# Patient Record
Sex: Female | Born: 1990 | Race: White | Hispanic: No | Marital: Married | Smoking: Former smoker
Health system: Southern US, Community
[De-identification: ages and names within clinical notes are randomized; demographics above are authoritative.]

## PROBLEM LIST (undated history)

## (undated) DIAGNOSIS — O139 Gestational [pregnancy-induced] hypertension without significant proteinuria, unspecified trimester: Secondary | ICD-10-CM

## (undated) DIAGNOSIS — F329 Major depressive disorder, single episode, unspecified: Secondary | ICD-10-CM

## (undated) DIAGNOSIS — B999 Unspecified infectious disease: Secondary | ICD-10-CM

## (undated) DIAGNOSIS — I10 Essential (primary) hypertension: Secondary | ICD-10-CM

## (undated) DIAGNOSIS — D649 Anemia, unspecified: Secondary | ICD-10-CM

## (undated) DIAGNOSIS — F988 Other specified behavioral and emotional disorders with onset usually occurring in childhood and adolescence: Secondary | ICD-10-CM

## (undated) DIAGNOSIS — O009 Unspecified ectopic pregnancy without intrauterine pregnancy: Secondary | ICD-10-CM

## (undated) DIAGNOSIS — F32A Depression, unspecified: Secondary | ICD-10-CM

## (undated) DIAGNOSIS — R87629 Unspecified abnormal cytological findings in specimens from vagina: Secondary | ICD-10-CM

## (undated) HISTORY — PX: ECTOPIC PREGNANCY SURGERY: SHX613

## (undated) HISTORY — PX: COLPOSCOPY: SHX161

---
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
## 2012-04-02 HISTORY — PX: OTHER SURGICAL HISTORY: SHX169

## 2015-03-15 ENCOUNTER — Inpatient Hospital Stay (HOSPITAL_COMMUNITY)
Admission: AD | Admit: 2015-03-15 | Discharge: 2015-03-15 | Disposition: A | Discharge status: Home / Self Care | Payer: BLUE CROSS/BLUE SHIELD | Source: Ambulatory Visit | Attending: Family Medicine | Admitting: Family Medicine

## 2015-03-15 ENCOUNTER — Encounter (HOSPITAL_COMMUNITY): Payer: Self-pay | Admitting: *Deleted

## 2015-03-15 DIAGNOSIS — R102 Pelvic and perineal pain: Secondary | ICD-10-CM | POA: Diagnosis not present

## 2015-03-15 DIAGNOSIS — N9489 Other specified conditions associated with female genital organs and menstrual cycle: Secondary | ICD-10-CM

## 2015-03-15 HISTORY — DX: Unspecified ectopic pregnancy without intrauterine pregnancy: O00.90

## 2015-03-15 HISTORY — DX: Other specified behavioral and emotional disorders with onset usually occurring in childhood and adolescence: F98.8

## 2015-03-15 HISTORY — DX: Unspecified abnormal cytological findings in specimens from vagina: R87.629

## 2015-03-15 LAB — URINALYSIS, ROUTINE W REFLEX MICROSCOPIC
BILIRUBIN URINE: NEGATIVE
Glucose, UA: NEGATIVE mg/dL
HGB URINE DIPSTICK: NEGATIVE
KETONES UR: NEGATIVE mg/dL
Leukocytes, UA: NEGATIVE
Nitrite: NEGATIVE
PROTEIN: NEGATIVE mg/dL
Specific Gravity, Urine: 1.02 (ref 1.005–1.030)
pH: 6.5 (ref 5.0–8.0)

## 2015-03-15 LAB — WET PREP, GENITAL
Clue Cells Wet Prep HPF POC: NONE SEEN
Sperm: NONE SEEN
Trich, Wet Prep: NONE SEEN
Yeast Wet Prep HPF POC: NONE SEEN

## 2015-03-15 LAB — POCT PREGNANCY, URINE: PREG TEST UR: NEGATIVE

## 2015-03-15 NOTE — MAU Note (Signed)
Pt presents to MAU with complaints of having pelvic pressure for a couple of weeks. States she feels something protruding out of her vaginal area.

## 2015-03-15 NOTE — Discharge Instructions (Signed)
You can get Colace over the counter and use this for constipation. Take 1 tablet twice daily for 1 week and see if this helps with your symptoms.   You were provided with a list of doctors in the area. Please establish care with a PCP or GYN physician.

## 2015-03-15 NOTE — MAU Provider Note (Signed)
History     CSN: [id number] Arrival date and time: 03/15/15 1336  First Provider Initiated Contact with Patient 03/15/15 1413      Chief Complaint  Patient presents with  . Pelvic Pain   HPI NAARA KELTY is a 24 y.o. [phone] with a history of ectopic pregnancy with Left salpingectomy presenting with vaginal pressure/pain. This has been present for several days. She reports she "feels like something is coming out." No leaking urine. Reports constipation for the last week. She does not identify aggravating or alleviating factors.    OB History    Gravida Para Term Preterm AB TAB SAB Ectopic Multiple Living   0      Past Medical History  Diagnosis Date  . Ectopic pregnancy   . ADD (attention deficit disorder)   . Vaginal Pap smear, abnormal     Past Surgical History  Procedure Laterality Date  . Fallopian tube  04-2012    Left removal with ectopic pregnancy  . Colposcopy      History reviewed. No pertinent family history.  Social History  Substance Use Topics  . Smoking status: Never Smoker   . Smokeless tobacco: None  . Alcohol Use: Yes     Comment: socially    Allergies: No Known Allergies  No prescriptions prior to admission   Review of Systems  Constitutional: Negative for fever and chills.  Eyes: Negative for blurred vision and double vision.  Respiratory: Negative for cough and shortness of breath.   Cardiovascular: Negative for chest pain and orthopnea.  Gastrointestinal: Negative for nausea and vomiting.  Genitourinary: Negative for dysuria, frequency and flank pain.  Musculoskeletal: Negative for myalgias.  Skin: Negative for rash.  Neurological: Negative for dizziness, tingling, weakness and headaches.  Endo/Heme/Allergies: Does not bruise/bleed easily.  Psychiatric/Behavioral: Negative for depression and suicidal ideas. The patient is not nervous/anxious.    Physical Exam   Blood pressure 145/81, pulse 74, temperature 98.2 F  (36.8 C), resp. rate 18, height  (1.676 m), weight 250 lb (113.399 kg), last menstrual period 01/31/2015.  Physical Exam  Nursing note and vitals reviewed. Constitutional: She is oriented to person, place, and time. She appears well-developed and well-nourished. No distress.  HENT:  Head: Normocephalic and atraumatic.  Eyes: Conjunctivae are normal. No scleral icterus.  Neck: Normal range of motion. Neck supple.  Cardiovascular: Normal rate and intact distal pulses.   Respiratory: Effort normal. She exhibits no tenderness.  GI: Soft. There is no tenderness. There is no rebound and no guarding.  Genitourinary: No labial fusion. There is no rash or tenderness on the right labia. There is no rash or tenderness on the left labia. No erythema in the vagina. Tenderness: thick white discharge. No signs of injury around the vagina. Vaginal discharge found.  While visualizing the perineum had patient bear down, no protrusion. Labia were spread and again patient performed valsalva which did not show any protrusion. Specululm exam s/f thick white discharge. Bimanual performed and patient had excellent pelvic floor muscle use  Musculoskeletal: Normal range of motion. She exhibits no edema.  Neurological: She is alert and oriented to person, place, and time.  Skin: Skin is warm and dry. No rash noted.  Psychiatric: She has a normal mood and affect.    MAU Course  Procedures  MDM UPT- negative UA- non infectious appearing GC/CT- collected Wet prep- negative   Assessment and Plan  TYWANDA RICE is a 24  y.o. [id number] presenting with vaginal pressure with completely normal exam. Possibly having constipation that effects her sensation of pressure. Recommended use of OTC medications and establishing care with GYN or PCP. She was offered reassurance and sent home. Questions were answered and return precautions reviewed.   Isa RankinKimberly Niles [facility] 03/15/2015, 2:55 PM

## 2015-03-16 LAB — GC/CHLAMYDIA PROBE AMP (~~LOC~~) NOT AT ARMC
Chlamydia: NEGATIVE
Neisseria Gonorrhea: NEGATIVE

## 2018-05-06 LAB — OB RESULTS CONSOLE HIV ANTIBODY (ROUTINE TESTING): HIV: NONREACTIVE

## 2018-05-06 LAB — OB RESULTS CONSOLE ANTIBODY SCREEN: Antibody Screen: NEGATIVE

## 2018-05-06 LAB — OB RESULTS CONSOLE GBS: GBS: POSITIVE

## 2018-05-06 LAB — OB RESULTS CONSOLE RUBELLA ANTIBODY, IGM: Rubella: IMMUNE

## 2018-05-06 LAB — OB RESULTS CONSOLE ABO/RH: RH Type: POSITIVE

## 2018-05-06 LAB — OB RESULTS CONSOLE HEPATITIS B SURFACE ANTIGEN: Hepatitis B Surface Ag: NEGATIVE

## 2018-05-06 LAB — OB RESULTS CONSOLE GC/CHLAMYDIA
Chlamydia: NEGATIVE
Gonorrhea: NEGATIVE

## 2018-05-06 LAB — OB RESULTS CONSOLE RPR: RPR: NONREACTIVE

## 2018-09-23 ENCOUNTER — Other Ambulatory Visit: Payer: Self-pay | Admitting: Obstetrics and Gynecology

## 2018-11-19 ENCOUNTER — Encounter (HOSPITAL_COMMUNITY): Payer: Self-pay | Admitting: *Deleted

## 2018-11-19 ENCOUNTER — Telehealth (HOSPITAL_COMMUNITY): Payer: Self-pay | Admitting: *Deleted

## 2018-11-19 NOTE — Patient Instructions (Signed)
Joan Garcia  11/19/2018   Your procedure is scheduled on:  11/30/2018  Arrive at 62 at Entrance C on [address] [facility]  and [address]. You are invited to use the FREE valet parking or use the Visitor's parking deck.  Pick up the phone at the desk and dial [phone].  Call this number if you have problems the morning of surgery: [phone]  Remember:   Do not eat food:(After Midnight) Desps de medianoche.  Do not drink clear liquids: (After Midnight) Desps de medianoche.  Take these medicines the morning of surgery with A SIP OF WATER:  none   Do not wear jewelry, make-up or nail polish.  Do not wear lotions, powders, or perfumes. Do not wear deodorant.  Do not shave 48 hours prior to surgery.  Do not bring valuables to the hospital.  [facility] is not   responsible for any belongings or valuables brought to the hospital.  Contacts, dentures or bridgework may not be worn into surgery.  Leave suitcase in the car. After surgery it may be brought to your room.  For patients admitted to the hospital, checkout time is 11:00 AM the day of              discharge.      Please read over the following fact sheets that you were given:     Preparing for Surgery

## 2018-11-19 NOTE — Telephone Encounter (Signed)
Preadmission screen  

## 2018-11-20 ENCOUNTER — Telehealth (HOSPITAL_COMMUNITY): Payer: Self-pay | Admitting: *Deleted

## 2018-11-20 NOTE — Telephone Encounter (Signed)
Preadmission screen  

## 2018-11-21 ENCOUNTER — Encounter (HOSPITAL_COMMUNITY): Payer: Self-pay

## 2018-11-24 ENCOUNTER — Inpatient Hospital Stay (HOSPITAL_COMMUNITY): Payer: BC Managed Care – PPO

## 2018-11-24 ENCOUNTER — Encounter (HOSPITAL_COMMUNITY): Payer: Self-pay | Admitting: *Deleted

## 2018-11-24 ENCOUNTER — Inpatient Hospital Stay (HOSPITAL_COMMUNITY)
Admission: AD | Admit: 2018-11-24 | Discharge: 2018-11-28 | DRG: 786 | Disposition: A | Discharge status: Home / Self Care | Payer: BC Managed Care – PPO | Attending: Obstetrics and Gynecology | Admitting: Obstetrics and Gynecology

## 2018-11-24 ENCOUNTER — Other Ambulatory Visit: Payer: Self-pay

## 2018-11-24 DIAGNOSIS — M545 Low back pain, unspecified: Secondary | ICD-10-CM

## 2018-11-24 DIAGNOSIS — Z20828 Contact with and (suspected) exposure to other viral communicable diseases: Secondary | ICD-10-CM | POA: Diagnosis present

## 2018-11-24 DIAGNOSIS — O4593 Premature separation of placenta, unspecified, third trimester: Secondary | ICD-10-CM | POA: Diagnosis present

## 2018-11-24 DIAGNOSIS — Z3A38 38 weeks gestation of pregnancy: Secondary | ICD-10-CM

## 2018-11-24 DIAGNOSIS — O99824 Streptococcus B carrier state complicating childbirth: Secondary | ICD-10-CM | POA: Diagnosis present

## 2018-11-24 DIAGNOSIS — O99214 Obesity complicating childbirth: Secondary | ICD-10-CM | POA: Diagnosis present

## 2018-11-24 DIAGNOSIS — O99213 Obesity complicating pregnancy, third trimester: Secondary | ICD-10-CM

## 2018-11-24 DIAGNOSIS — O34211 Maternal care for low transverse scar from previous cesarean delivery: Principal | ICD-10-CM | POA: Diagnosis present

## 2018-11-24 DIAGNOSIS — O26893 Other specified pregnancy related conditions, third trimester: Secondary | ICD-10-CM

## 2018-11-24 DIAGNOSIS — Z6841 Body Mass Index (BMI) 40.0 and over, adult: Secondary | ICD-10-CM

## 2018-11-24 DIAGNOSIS — O403XX0 Polyhydramnios, third trimester, not applicable or unspecified: Secondary | ICD-10-CM | POA: Diagnosis present

## 2018-11-24 DIAGNOSIS — O139 Gestational [pregnancy-induced] hypertension without significant proteinuria, unspecified trimester: Secondary | ICD-10-CM

## 2018-11-24 DIAGNOSIS — O134 Gestational [pregnancy-induced] hypertension without significant proteinuria, complicating childbirth: Secondary | ICD-10-CM | POA: Diagnosis present

## 2018-11-24 DIAGNOSIS — Z87891 Personal history of nicotine dependence: Secondary | ICD-10-CM

## 2018-11-24 DIAGNOSIS — O288 Other abnormal findings on antenatal screening of mother: Secondary | ICD-10-CM

## 2018-11-24 DIAGNOSIS — O9081 Anemia of the puerperium: Secondary | ICD-10-CM | POA: Diagnosis not present

## 2018-11-24 DIAGNOSIS — R102 Pelvic and perineal pain: Secondary | ICD-10-CM

## 2018-11-24 DIAGNOSIS — O9989 Other specified diseases and conditions complicating pregnancy, childbirth and the puerperium: Secondary | ICD-10-CM

## 2018-11-24 DIAGNOSIS — O289 Unspecified abnormal findings on antenatal screening of mother: Secondary | ICD-10-CM

## 2018-11-24 DIAGNOSIS — O133 Gestational [pregnancy-induced] hypertension without significant proteinuria, third trimester: Secondary | ICD-10-CM

## 2018-11-24 HISTORY — DX: Unspecified infectious disease: B99.9

## 2018-11-24 HISTORY — DX: Depression, unspecified: F32.A

## 2018-11-24 LAB — COMPREHENSIVE METABOLIC PANEL
ALT: 17 U/L (ref 0–44)
AST: 21 U/L (ref 15–41)
Albumin: 2.5 g/dL — ABNORMAL LOW (ref 3.5–5.0)
Alkaline Phosphatase: 91 U/L (ref 38–126)
Anion gap: 10 (ref 5–15)
BUN: 5 mg/dL — ABNORMAL LOW (ref 6–20)
CO2: 19 mmol/L — ABNORMAL LOW (ref 22–32)
Calcium: 8.9 mg/dL (ref 8.9–10.3)
Chloride: 109 mmol/L (ref 98–111)
Creatinine, Ser: 0.59 mg/dL (ref 0.44–1.00)
GFR calc Af Amer: >60 mL/min (ref >60)
GFR calc non Af Amer: >60 mL/min (ref >60)
Glucose, Bld: 107 mg/dL — ABNORMAL HIGH (ref 70–99)
Potassium: 3.6 mmol/L (ref 3.5–5.1)
Sodium: 138 mmol/L (ref 135–145)
Total Bilirubin: 0.5 mg/dL (ref 0.3–1.2)
Total Protein: 5.9 g/dL — ABNORMAL LOW (ref 6.5–8.1)

## 2018-11-24 LAB — PROTEIN / CREATININE RATIO, URINE
Creatinine, Urine: 92.32 mg/dL
Protein Creatinine Ratio: 0.09 mg/mg{Cre} (ref 0.00–0.15)
Total Protein, Urine: 8 mg/dL

## 2018-11-24 LAB — CBC
HCT: 32.8 % — ABNORMAL LOW (ref 36.0–46.0)
Hemoglobin: 10.5 g/dL — ABNORMAL LOW (ref 12.0–15.0)
MCH: 27.8 pg (ref 26.0–34.0)
MCHC: 32 g/dL (ref 30.0–36.0)
MCV: 86.8 fL (ref 80.0–100.0)
Platelets: 199 10*3/uL (ref 150–400)
RBC: 3.78 MIL/uL — ABNORMAL LOW (ref 3.87–5.11)
RDW: 14.7 % (ref 11.5–15.5)
WBC: 10.5 10*3/uL (ref 4.0–10.5)
nRBC: 0 % (ref 0.0–0.2)

## 2018-11-24 LAB — TYPE AND SCREEN
ABO/RH(D): A POS
Antibody Screen: NEGATIVE

## 2018-11-24 MED ORDER — CALCIUM CARBONATE ANTACID 500 MG PO CHEW
2.0000 | CHEWABLE_TABLET | ORAL | Status: DC | PRN
  Filled 2018-11-24: qty 2

## 2018-11-24 MED ORDER — ZOLPIDEM TARTRATE 5 MG PO TABS: 5.0000 mg | ORAL_TABLET | Freq: Every evening | ORAL | Status: DC | PRN

## 2018-11-24 MED ORDER — DOCUSATE SODIUM 100 MG PO CAPS
100.0000 mg | ORAL_CAPSULE | Freq: Every day | ORAL | Status: DC
  Administered 2018-11-24: 20:00:00 100 mg via ORAL
  Filled 2018-11-24: qty 1

## 2018-11-24 MED ORDER — SODIUM CHLORIDE 0.9 % IV SOLN: 250.0000 mL | INTRAVENOUS | Status: DC | PRN

## 2018-11-24 MED ORDER — SODIUM CHLORIDE 0.9% FLUSH: 3.0000 mL | INTRAVENOUS | Status: DC | PRN

## 2018-11-24 MED ORDER — LACTATED RINGERS IV SOLN
INTRAVENOUS | Status: DC
  Administered 2018-11-25 (×2): via INTRAVENOUS

## 2018-11-24 MED ORDER — CEFAZOLIN SODIUM-DEXTROSE 2-4 GM/100ML-% IV SOLN
2.0000 g | INTRAVENOUS | Status: DC
  Filled 2018-11-24: qty 100

## 2018-11-24 MED ORDER — ACETAMINOPHEN 325 MG PO TABS
650.0000 mg | ORAL_TABLET | ORAL | Status: DC | PRN
  Administered 2018-11-24: 22:00:00 650 mg via ORAL
  Filled 2018-11-24: qty 2

## 2018-11-24 MED ORDER — SODIUM CHLORIDE 0.9% FLUSH: 3.0000 mL | Freq: Two times a day (BID) | INTRAVENOUS | Status: DC

## 2018-11-24 NOTE — MAU Note (Signed)
Sent from office with non-reactive tracing.  Being followed with polyhydramnios. No leaking or bleeding.  Some pelvic and low back pain.

## 2018-11-24 NOTE — H&P (Signed)
OB ADMISSION/ HISTORY & PHYSICAL:  Admission Date: 11/24/2018  2:10 PM  Admit Diagnosis: Joan Garcia, Joan Garcia is a 28 y.o. female,  [phone] at 46w1dpresenting to MAU after she had a non-reactive NST in the office today. BP noted elevated in MAU, PEC work up negative and antenatal fetal testing reassuring, BPP 8/8, polyhydramnios slightly improved from previous. Decision is made to admit patient overnight with planned repeat cesarean section in AM, NPO after midnight.    Prenatal History: [id number]  EDC : 12/07/2018, by [facility]  [facility]   Prenatal course complicated by: Morbid obesity (BMI 52.7), polyhydramnios, gestational HTN, anemia, GBS positive, previous cesarean section x 1, ADHD, hx postpartum depression, dependent edema  Current medications/supplements:   Prenatal Labs: ABO, Rh: A positive(02/04 0000)  Antibody: Negative (02/04 0000) Rubella: Immune (02/04 0000)  RPR: Nonreactive (02/04 0000)  HBsAg: Negative (02/04 0000)  HIV: Non-reactive (02/04 0000)  GBS: Positive (02/04 0000)  1 hr Glucola : 105 Genetic Screening: AFP normal, normal NT sono Ultrasound: normal anatomy, anterior placenta  26 wks F/U sono for heart and face views, noted AFI increased 26.2 cm 33 wks AFI 33.3 cm 36 wks AFI 36.6 cm, EFW 7 lbs, 81%tile    Medical / Surgical History :  Past medical history:  Past Medical History:  Diagnosis Date  . ADD (attention deficit disorder)   . Anemia   . Depression    hx post partum  . Ectopic pregnancy   . Hypertension   . Infection    UTI  . Pregnancy induced hypertension   . Vaginal Pap smear, abnormal      Past surgical history:  Past Surgical History:  Procedure Laterality Date  . CESAREAN SECTION    . COLPOSCOPY    . ECTOPIC PREGNANCY SURGERY     laparotomy  . fallopian tube  04-2012   Left removal with ectopic pregnancy     Family History:  Family History   Problem Relation Age of Onset  . Asthma Mother   . Heart failure Maternal Grandmother      Social History:  reports that she has quit smoking. She has never used smokeless tobacco. She reports previous alcohol use. She reports that she does not use drugs.   Allergies: Patient has no known allergies.   Current Medications at time of admission:  PNVs, iron, Flexeril  Review of Systems: ROS Pt denies VB, LOF, ctx, decreased FM, vaginal discharge/odor/itching. Pt denies N/V, abdominal pain, constipation, diarrhea, or urinary problems. Pt denies fever, chills, fatigue, sweating or changes in appetite. Pt denies SOB or chest pain. Pt denies dizziness, HA, light-headedness, weakness.  Physical Exam: Vital signs and nursing notes reviewed.  ED Triage Vitals  Enc Vitals Group     BP 11/24/18 1452 (!) 140/91     Pulse Rate 11/24/18 1452 (!) 106     Resp 11/24/18 1452 18     Temp 11/24/18 1452 98.7 F (37.1 C)     Temp Source 11/24/18 1452 Oral     SpO2 11/24/18 1455 98 %     Weight 11/24/18 1426 (!) 343 lb 12.8 oz (155.9 kg)     Height 11/24/18 1426 5' 8" (1.727 m)     Pain Score 11/24/18 1451 5   . Vitals:   11/24/18 1801 11/24/18 1816 11/24/18 1831 11/24/18 2027  BP: (!) 142/87 (!) 136/110 114/74 124/88  Pulse: 100 (!) 119  92  Resp:    18  Temp:    97.9 F (36.6 C)  TempSrc:    Oral  SpO2:    100%  Weight:      Height:       General: AAO x 3, NAD Heart: RRR Lungs:CTAB Abdomen: Gravid, NT, obese Extremities: +1 pedal edema, SCD's while in bed Genitalia / VE:   deferred  FHR: 135 BPM, moderate variability, + accels, no decels TOCO: Ctx mild, patient does not feel them  Labs:    Recent Labs    11/24/18 1533  WBC 10.5  HGB 10.5*  HCT 32.8*  PLT 199     Hepatic Function Latest Ref Rng & Units 11/24/2018  Total Protein 6.5 - 8.1 g/dL 5.9(L)  Albumin 3.5 - 5.0 g/dL 2.5(L)  AST 15 - 41 U/L 21  ALT 0 - 44 U/L 17  Alk Phosphatase 38 - 126 U/L 91  Total  Bilirubin 0.3 - 1.2 mg/dL 0.5   Pending: RPR, COVID  Assessment:  28 y.o. 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 RHavery MorosCNM, MSN 11/24/2018, 7:39 PM

## 2018-11-24 NOTE — MAU Provider Note (Signed)
History     CSN: [id number]  Arrival date and time: 11/24/18 1410   First Provider Initiated Contact with Patient 11/24/18 1541      Chief Complaint  Patient presents with  . non-reactive fetal tracing   Joan Garcia is a 28 y.o. [phone] at 3467w1d who presents to MAU after she had a non-reactive NST in the office today. Pt reports pelvic pain and low back pain. Pt reports the pelvic pain started late last night, but the back pain has been on-going for weeks, but worse the past few days. Rates pelvic pain 5/10, back pain as 7/10. Pt reports she does have a pregnancy support belt at home that she wears "here and there." Pt has repeat C/S schedule for 11/30/2018, elective. Pt denies any symptoms today.  Pt denies VB, LOF, ctx, decreased FM, vaginal discharge/odor/itching. Pt denies N/V, abdominal pain, constipation, diarrhea, or urinary problems. Pt denies fever, chills, fatigue, sweating or changes in appetite. Pt denies SOB or chest pain. Pt denies dizziness, HA, light-headedness, weakness.  Problems this pregnancy include: polyhydramnios, cHTN?, anemia. Allergies? NKDA Current medications/supplements? PNVs, iron, Flexeril Prenatal care provider? [facility], next appt 11/26/2018   OB History    Gravida  4   Para  1   Term  1   Preterm      AB  2   Living  1     SAB  1   TAB      Ectopic  1   Multiple      Live Births  1        Obstetric Comments  Failure to progress, "cord in the way, when they went to break water:        Past Medical History:  Diagnosis Date  . ADD (attention deficit disorder)   . Anemia   . Depression    hx post partum  . Ectopic pregnancy   . Hypertension   . Infection    UTI  . Pregnancy induced hypertension   . Vaginal Pap smear, abnormal     Past Surgical History:  Procedure Laterality Date  . CESAREAN SECTION    . COLPOSCOPY    . ECTOPIC PREGNANCY SURGERY     laparotomy  . fallopian tube  04-2012   Left removal  with ectopic pregnancy    Family History  Problem Relation Age of Onset  . Asthma Mother   . Heart failure Maternal Grandmother     Social History   Tobacco Use  . Smoking status: Former Games developermoker  . Smokeless tobacco: Never Used  . Tobacco comment: quit age 28  Substance Use Topics  . Alcohol use: Not Currently    Comment: socially  . Drug use: No    Allergies: No Known Allergies  No medications prior to admission.    Review of Systems  Constitutional: Negative for chills, diaphoresis, fatigue and fever.  Respiratory: Negative for shortness of breath.   Cardiovascular: Negative for chest pain.  Gastrointestinal: Negative for abdominal pain, constipation, diarrhea, nausea and vomiting.  Genitourinary: Positive for pelvic pain. Negative for dysuria, flank pain, frequency, urgency, vaginal bleeding and vaginal discharge.  Musculoskeletal: Positive for back pain.  Neurological: Negative for dizziness, weakness, light-headedness and headaches.   Physical Exam   Blood pressure (!) 142/87, pulse 100, temperature 98.7 F (37.1 C), temperature source Oral, resp. rate 18, height 5\' 8"  (1.727 m), weight (!) 155.9 kg, SpO2 99 %.  Patient Vitals for the past 24 hrs:  BP Temp  Temp src Pulse Resp SpO2 Height Weight  11/24/18 1801 (!) 142/87 - - 100 - - - -  11/24/18 1751 (!) 138/97 - - (!) 104 - - - -  11/24/18 1700 122/83 - - (!) 115 - - - -  11/24/18 1646 (!) 157/100 - - 99 - - - -  11/24/18 1631 (!) 143/95 - - (!) 101 - - - -  11/24/18 1616 127/77 - - (!) 149 - - - -  11/24/18 1600 (!) 143/93 - - 96 - 99 % - -  11/24/18 1530 135/87 - - (!) 112 - 97 % - -  11/24/18 1515 (!) 132/91 - - (!) 101 - 97 % - -  11/24/18 1503 121/84 - - (!) 103 - - - -  11/24/18 1455 - - - - - 98 % - -  11/24/18 1452 (!) 140/91 98.7 F (37.1 C) Oral (!) 106 18 - - -  11/24/18 1426 - - - - - - 5\' 8"  (1.727 m) (!) 155.9 kg   Physical Exam  Constitutional: She is oriented to person, place, and time.  She appears well-developed and well-nourished. No distress.  HENT:  Head: Normocephalic and atraumatic.  Respiratory: Effort normal.  GI: Soft. She exhibits no distension and no mass. There is no abdominal tenderness. There is no rebound and no guarding.  Genitourinary:    Genitourinary Comments: CE: long/closed/posterior   Neurological: She is alert and oriented to person, place, and time.  Skin: Skin is warm and dry. She is not diaphoretic.  Psychiatric: She has a normal mood and affect. Her behavior is normal. Judgment and thought content normal.   Results for orders placed or performed during the hospital encounter of 11/24/18 (from the past 24 hour(s))  Protein / creatinine ratio, urine     Status: None   Collection Time: 11/24/18  3:30 PM  Result Value Ref Range   Creatinine, Urine 92.32 mg/dL   Total Protein, Urine 8 mg/dL   Protein Creatinine Ratio 0.09 0.00 - 0.15 mg/mg[Cre]  CBC     Status: Abnormal   Collection Time: 11/24/18  3:33 PM  Result Value Ref Range   WBC 10.5 4.0 - 10.5 K/uL   RBC 3.78 (L) 3.87 - 5.11 MIL/uL   Hemoglobin 10.5 (L) 12.0 - 15.0 g/dL   HCT 32.8 (L) 36.0 - 46.0 %   MCV 86.8 80.0 - 100.0 fL   MCH 27.8 26.0 - 34.0 pg   MCHC 32.0 30.0 - 36.0 g/dL   RDW 14.7 11.5 - 15.5 %   Platelets 199 150 - 400 K/uL   nRBC 0.0 0.0 - 0.2 %  Comprehensive metabolic panel     Status: Abnormal   Collection Time: 11/24/18  3:33 PM  Result Value Ref Range   Sodium 138 135 - 145 mmol/L   Potassium 3.6 3.5 - 5.1 mmol/L   Chloride 109 98 - 111 mmol/L   CO2 19 (L) 22 - 32 mmol/L   Glucose, Bld 107 (H) 70 - 99 mg/dL   BUN 5 (L) 6 - 20 mg/dL   Creatinine, Ser 0.59 0.44 - 1.00 mg/dL   Calcium 8.9 8.9 - 10.3 mg/dL   Total Protein 5.9 (L) 6.5 - 8.1 g/dL   Albumin 2.5 (L) 3.5 - 5.0 g/dL   AST 21 15 - 41 U/L   ALT 17 0 - 44 U/L   Alkaline Phosphatase 91 38 - 126 U/L   Total Bilirubin 0.5 0.3 - 1.2 mg/dL  GFR calc non Af Amer >60 >60 mL/min   GFR calc Af Amer >60 >60  mL/min   Anion gap 10 5 - 15   No results found.  MAU Course  Procedures  MDM -NRNST in office, elevated BP in MAU, denies s/sx of preeclampsia -spoke with Dr. Estanislado Pandyivard @1525 , requests BPP, reports pt has possible cHTN but has not had any protein in urine in office. Dr. Estanislado Pandyivard states that if NST/BPP/labs are normal, pt can be discharged home. -CE: long/closed/posterior -CBC: H/H 10.5/32.8, otherwise WNL (platelets 199) -CMP: no abnormalities requiring treatment (AST/ALT 21/17) -PCr: 0.09 -EFM: reactive with few variables       -baseline: 150       -variability: moderate       -accels: present, 15x15       -decels: few variable       -TOCO: few, irregular ctx -BPP: 8/8, AFI ~39cm -called and spoke with Dr. Estanislado Pandyivard @1757 , discussed lab results, NST and BP and recommended patient stay for C/S tonight d/t gHTN @38wks  as patient states she has had elevated BPs in pregnancy only after [redacted]wks gestation and per [facility] OB notes state PIH work-up @34wks . Per Dr. Estanislado Pandyivard, [facility] and monitoring of BP and for C/S in AM. Dr. Estanislado Pandyivard to enter admission orders and patient to be NPO. -[facility] and plan for AM C/S  Orders Placed This Encounter  Procedures  . US MFM FETAL BPP WO NON STRESS    Standing Status:   Standing    Number of Occurrences:   1    Order Specific Question:   Symptom/Reason for Exam    Answer:   Non-reactive NST (non-stress test) [[id number]][[id number]]  . CBC    Standing Status:   Standing    Number of Occurrences:   1  . Comprehensive metabolic panel    Standing Status:   Standing    Number of Occurrences:   1  . Protein / creatinine ratio, urine    Standing Status:   Standing    Number of Occurrences:   1   No orders of the defined types were placed in this encounter.  Assessment and Plan   1. Gestational hypertension, third trimester   2. Non-reactive NST (non-stress test)   3. Pelvic pain   4. Low back pain  during pregnancy in third trimester   5. Polyhydramnios in third trimester complication, single or unspecified fetus    -[facility] and plan for C/S in AM  Monicia Tse E Nolyn Eilert 11/24/2018, 6:23 PM

## 2018-11-25 ENCOUNTER — Encounter (HOSPITAL_COMMUNITY): Payer: Self-pay | Admitting: Nurse Practitioner

## 2018-11-25 ENCOUNTER — Inpatient Hospital Stay (HOSPITAL_COMMUNITY): Payer: BC Managed Care – PPO | Admitting: Anesthesiology

## 2018-11-25 ENCOUNTER — Inpatient Hospital Stay (HOSPITAL_COMMUNITY): Admit: 2018-11-25 | Payer: BLUE CROSS/BLUE SHIELD | Admitting: Obstetrics and Gynecology

## 2018-11-25 ENCOUNTER — Encounter (HOSPITAL_COMMUNITY)
Admission: AD | Disposition: A | Discharge status: Home / Self Care | Payer: Self-pay | Source: Home / Self Care | Attending: Obstetrics and Gynecology

## 2018-11-25 ENCOUNTER — Other Ambulatory Visit: Payer: Self-pay | Admitting: Obstetrics and Gynecology

## 2018-11-25 LAB — SARS CORONAVIRUS 2 (TAT 6-24 HRS): SARS Coronavirus 2: NEGATIVE

## 2018-11-25 LAB — RPR: RPR Ser Ql: NONREACTIVE

## 2018-11-25 LAB — ABO/RH: ABO/RH(D): A POS

## 2018-11-25 SURGERY — Surgical Case
Anesthesia: Spinal | Wound class: Clean Contaminated

## 2018-11-25 MED ORDER — NALBUPHINE HCL 10 MG/ML IJ SOLN: 5.0000 mg | Freq: Once | INTRAMUSCULAR | Status: DC | PRN

## 2018-11-25 MED ORDER — SIMETHICONE 80 MG PO CHEW
80.0000 mg | CHEWABLE_TABLET | ORAL | Status: DC
  Administered 2018-11-25 – 2018-11-27 (×3): 80 mg via ORAL
  Filled 2018-11-25 (×3): qty 1

## 2018-11-25 MED ORDER — MEPERIDINE HCL 25 MG/ML IJ SOLN: 6.2500 mg | INTRAMUSCULAR | Status: DC | PRN

## 2018-11-25 MED ORDER — SIMETHICONE 80 MG PO CHEW
80.0000 mg | CHEWABLE_TABLET | Freq: Three times a day (TID) | ORAL | Status: DC
  Administered 2018-11-26 – 2018-11-28 (×6): 80 mg via ORAL
  Filled 2018-11-25 (×6): qty 1

## 2018-11-25 MED ORDER — MENTHOL 3 MG MT LOZG: 1.0000 | LOZENGE | OROMUCOSAL | Status: DC | PRN

## 2018-11-25 MED ORDER — DIPHENHYDRAMINE HCL 25 MG PO CAPS: 25.0000 mg | ORAL_CAPSULE | Freq: Four times a day (QID) | ORAL | Status: DC | PRN

## 2018-11-25 MED ORDER — SCOPOLAMINE 1 MG/3DAYS TD PT72
MEDICATED_PATCH | TRANSDERMAL | Status: AC
  Filled 2018-11-25: qty 1

## 2018-11-25 MED ORDER — OXYCODONE HCL 5 MG/5ML PO SOLN: 5.0000 mg | Freq: Once | ORAL | Status: DC | PRN

## 2018-11-25 MED ORDER — OXYTOCIN 40 UNITS IN NORMAL SALINE INFUSION - SIMPLE MED: 2.5000 [IU]/h | INTRAVENOUS | Status: AC

## 2018-11-25 MED ORDER — TETANUS-DIPHTH-ACELL PERTUSSIS 5-2.5-18.5 LF-MCG/0.5 IM SUSP
0.5000 mL | Freq: Once | INTRAMUSCULAR | Status: AC
  Administered 2018-11-26: 08:00:00 0.5 mL via INTRAMUSCULAR
  Filled 2018-11-25: qty 0.5

## 2018-11-25 MED ORDER — NALOXONE HCL 4 MG/10ML IJ SOLN
1.0000 ug/kg/h | INTRAVENOUS | Status: DC | PRN
  Filled 2018-11-25: qty 5

## 2018-11-25 MED ORDER — IBUPROFEN 600 MG PO TABS
600.0000 mg | ORAL_TABLET | Freq: Four times a day (QID) | ORAL | Status: DC | PRN
  Administered 2018-11-26 – 2018-11-28 (×6): 600 mg via ORAL
  Filled 2018-11-25 (×7): qty 1

## 2018-11-25 MED ORDER — FENTANYL CITRATE (PF) 100 MCG/2ML IJ SOLN
INTRAMUSCULAR | Status: DC | PRN
  Administered 2018-11-25: 15 ug via INTRAVENOUS

## 2018-11-25 MED ORDER — ONDANSETRON HCL 4 MG/2ML IJ SOLN
4.0000 mg | Freq: Three times a day (TID) | INTRAMUSCULAR | Status: DC | PRN
  Administered 2018-11-25: 10:00:00 4 mg via INTRAVENOUS

## 2018-11-25 MED ORDER — FENTANYL CITRATE (PF) 100 MCG/2ML IJ SOLN
INTRAMUSCULAR | Status: AC
  Filled 2018-11-25: qty 2

## 2018-11-25 MED ORDER — ZOLPIDEM TARTRATE 5 MG PO TABS: 5.0000 mg | ORAL_TABLET | Freq: Every evening | ORAL | Status: DC | PRN

## 2018-11-25 MED ORDER — NALBUPHINE HCL 10 MG/ML IJ SOLN: 5.0000 mg | INTRAMUSCULAR | Status: DC | PRN

## 2018-11-25 MED ORDER — OXYCODONE HCL 5 MG PO TABS
5.0000 mg | ORAL_TABLET | ORAL | Status: DC | PRN
  Administered 2018-11-26: 19:00:00 10 mg via ORAL
  Administered 2018-11-26: 5 mg via ORAL
  Administered 2018-11-27 (×3): 10 mg via ORAL
  Administered 2018-11-27: 16:00:00 5 mg via ORAL
  Administered 2018-11-28 (×2): 10 mg via ORAL
  Filled 2018-11-25 (×2): qty 2
  Filled 2018-11-25: qty 1
  Filled 2018-11-25: qty 2
  Filled 2018-11-25: qty 1
  Filled 2018-11-25 (×3): qty 2

## 2018-11-25 MED ORDER — SODIUM CHLORIDE 0.9 % IV SOLN
INTRAVENOUS | Status: DC | PRN
  Administered 2018-11-25: 11:00:00 40 [IU] via INTRAVENOUS

## 2018-11-25 MED ORDER — DEXTROSE 5 % IV SOLN
3.0000 g | Freq: Once | INTRAVENOUS | Status: DC
  Filled 2018-11-25 (×2): qty 3000

## 2018-11-25 MED ORDER — HYDROMORPHONE HCL 1 MG/ML IJ SOLN: 0.2500 mg | INTRAMUSCULAR | Status: DC | PRN

## 2018-11-25 MED ORDER — SCOPOLAMINE 1 MG/3DAYS TD PT72
1.0000 | MEDICATED_PATCH | Freq: Once | TRANSDERMAL | Status: AC
  Administered 2018-11-25: 12:00:00 1.5 mg via TRANSDERMAL

## 2018-11-25 MED ORDER — ONDANSETRON HCL 4 MG/2ML IJ SOLN
INTRAMUSCULAR | Status: AC
  Filled 2018-11-25: qty 2

## 2018-11-25 MED ORDER — METHYLERGONOVINE MALEATE 0.2 MG PO TABS: 0.2000 mg | ORAL_TABLET | ORAL | Status: DC | PRN

## 2018-11-25 MED ORDER — ALBUMIN HUMAN 5 % IV SOLN
INTRAVENOUS | Status: AC
  Filled 2018-11-25: qty 250

## 2018-11-25 MED ORDER — MORPHINE SULFATE (PF) 0.5 MG/ML IJ SOLN
INTRAMUSCULAR | Status: AC
  Filled 2018-11-25: qty 10

## 2018-11-25 MED ORDER — DIPHENHYDRAMINE HCL 50 MG/ML IJ SOLN: 12.5000 mg | INTRAMUSCULAR | Status: DC | PRN

## 2018-11-25 MED ORDER — OXYCODONE HCL 5 MG PO TABS: 5.0000 mg | ORAL_TABLET | Freq: Once | ORAL | Status: DC | PRN

## 2018-11-25 MED ORDER — SODIUM CHLORIDE 0.9 % IR SOLN
Status: DC | PRN
  Administered 2018-11-25: 600 mL
  Administered 2018-11-25: 200 mL

## 2018-11-25 MED ORDER — EPHEDRINE 5 MG/ML INJ
INTRAVENOUS | Status: AC
  Filled 2018-11-25: qty 10

## 2018-11-25 MED ORDER — SIMETHICONE 80 MG PO CHEW
80.0000 mg | CHEWABLE_TABLET | ORAL | Status: DC | PRN
  Administered 2018-11-26: 21:00:00 80 mg via ORAL
  Filled 2018-11-25: qty 1

## 2018-11-25 MED ORDER — METOCLOPRAMIDE HCL 5 MG/ML IJ SOLN
INTRAMUSCULAR | Status: DC | PRN
  Administered 2018-11-25: 10 mg via INTRAVENOUS

## 2018-11-25 MED ORDER — SENNOSIDES-DOCUSATE SODIUM 8.6-50 MG PO TABS
2.0000 | ORAL_TABLET | ORAL | Status: DC
  Administered 2018-11-25 – 2018-11-27 (×3): 2 via ORAL
  Filled 2018-11-25 (×3): qty 2

## 2018-11-25 MED ORDER — KETOROLAC TROMETHAMINE 30 MG/ML IJ SOLN
INTRAMUSCULAR | Status: AC
  Filled 2018-11-25: qty 1

## 2018-11-25 MED ORDER — FERROUS SULFATE 325 (65 FE) MG PO TABS
325.0000 mg | ORAL_TABLET | Freq: Two times a day (BID) | ORAL | Status: DC
  Administered 2018-11-26 – 2018-11-28 (×5): 325 mg via ORAL
  Filled 2018-11-25 (×5): qty 1

## 2018-11-25 MED ORDER — WITCH HAZEL-GLYCERIN EX PADS: 1.0000 "application " | MEDICATED_PAD | CUTANEOUS | Status: DC | PRN

## 2018-11-25 MED ORDER — DIPHENHYDRAMINE HCL 25 MG PO CAPS: 25.0000 mg | ORAL_CAPSULE | ORAL | Status: DC | PRN

## 2018-11-25 MED ORDER — MEASLES, MUMPS & RUBELLA VAC IJ SOLR: 0.5000 mL | Freq: Once | INTRAMUSCULAR | Status: DC

## 2018-11-25 MED ORDER — PHENYLEPHRINE HCL-NACL 20-0.9 MG/250ML-% IV SOLN
INTRAVENOUS | Status: AC
  Filled 2018-11-25: qty 250

## 2018-11-25 MED ORDER — PRENATAL MULTIVITAMIN CH
1.0000 | ORAL_TABLET | Freq: Every day | ORAL | Status: DC
  Administered 2018-11-26 – 2018-11-28 (×3): 1 via ORAL
  Filled 2018-11-25 (×3): qty 1

## 2018-11-25 MED ORDER — BUPIVACAINE HCL (PF) 0.25 % IJ SOLN
INTRAMUSCULAR | Status: DC | PRN
  Administered 2018-11-25: 20 mL

## 2018-11-25 MED ORDER — PROMETHAZINE HCL 25 MG/ML IJ SOLN: 6.2500 mg | INTRAMUSCULAR | Status: DC | PRN

## 2018-11-25 MED ORDER — PHENYLEPHRINE 40 MCG/ML (10ML) SYRINGE FOR IV PUSH (FOR BLOOD PRESSURE SUPPORT)
PREFILLED_SYRINGE | INTRAVENOUS | Status: DC | PRN
  Administered 2018-11-25 (×7): 80 ug via INTRAVENOUS

## 2018-11-25 MED ORDER — BUPIVACAINE HCL (PF) 0.25 % IJ SOLN
INTRAMUSCULAR | Status: AC
  Filled 2018-11-25: qty 20

## 2018-11-25 MED ORDER — PHENYLEPHRINE 40 MCG/ML (10ML) SYRINGE FOR IV PUSH (FOR BLOOD PRESSURE SUPPORT)
PREFILLED_SYRINGE | INTRAVENOUS | Status: AC
  Filled 2018-11-25: qty 10

## 2018-11-25 MED ORDER — DIBUCAINE (PERIANAL) 1 % EX OINT: 1.0000 "application " | TOPICAL_OINTMENT | CUTANEOUS | Status: DC | PRN

## 2018-11-25 MED ORDER — PHENYLEPHRINE HCL-NACL 20-0.9 MG/250ML-% IV SOLN
INTRAVENOUS | Status: DC | PRN
  Administered 2018-11-25: 60 ug/min via INTRAVENOUS

## 2018-11-25 MED ORDER — COCONUT OIL OIL: 1.0000 "application " | TOPICAL_OIL | Status: DC | PRN

## 2018-11-25 MED ORDER — SOD CITRATE-CITRIC ACID 500-334 MG/5ML PO SOLN
ORAL | Status: AC
  Administered 2018-11-25: 10:00:00 15 mL
  Filled 2018-11-25: qty 15

## 2018-11-25 MED ORDER — EPHEDRINE SULFATE 50 MG/ML IJ SOLN
INTRAMUSCULAR | Status: DC | PRN
  Administered 2018-11-25: 10 mg via INTRAVENOUS

## 2018-11-25 MED ORDER — KETOROLAC TROMETHAMINE 30 MG/ML IJ SOLN
30.0000 mg | Freq: Once | INTRAMUSCULAR | Status: AC | PRN
  Administered 2018-11-25: 12:00:00 30 mg via INTRAVENOUS

## 2018-11-25 MED ORDER — LACTATED RINGERS IV SOLN: INTRAVENOUS | Status: DC

## 2018-11-25 MED ORDER — DEXAMETHASONE SODIUM PHOSPHATE 10 MG/ML IJ SOLN
INTRAMUSCULAR | Status: AC
  Filled 2018-11-25: qty 1

## 2018-11-25 MED ORDER — ACETAMINOPHEN 500 MG PO TABS
1000.0000 mg | ORAL_TABLET | Freq: Four times a day (QID) | ORAL | Status: DC
  Administered 2018-11-25 – 2018-11-28 (×10): 1000 mg via ORAL
  Filled 2018-11-25 (×11): qty 2

## 2018-11-25 MED ORDER — ALBUMIN HUMAN 5 % IV SOLN
INTRAVENOUS | Status: DC | PRN
  Administered 2018-11-25: 12:00:00 via INTRAVENOUS

## 2018-11-25 MED ORDER — EPHEDRINE SULFATE-NACL 50-0.9 MG/10ML-% IV SOSY
PREFILLED_SYRINGE | INTRAVENOUS | Status: DC | PRN
  Administered 2018-11-25 (×3): 10 mg via INTRAVENOUS
  Administered 2018-11-25: 5 mg via INTRAVENOUS
  Administered 2018-11-25: 15 mg via INTRAVENOUS

## 2018-11-25 MED ORDER — MORPHINE SULFATE (PF) 0.5 MG/ML IJ SOLN
INTRAMUSCULAR | Status: DC | PRN
  Administered 2018-11-25: .15 mg via EPIDURAL

## 2018-11-25 MED ORDER — SODIUM CHLORIDE 0.9% FLUSH: 3.0000 mL | INTRAVENOUS | Status: DC | PRN

## 2018-11-25 MED ORDER — SODIUM CHLORIDE 0.9 % IV SOLN
INTRAVENOUS | Status: DC | PRN
  Administered 2018-11-25: 11:00:00 via INTRAVENOUS

## 2018-11-25 MED ORDER — OXYTOCIN 40 UNITS IN NORMAL SALINE INFUSION - SIMPLE MED
INTRAVENOUS | Status: AC
  Filled 2018-11-25: qty 1000

## 2018-11-25 MED ORDER — ENOXAPARIN SODIUM 80 MG/0.8ML ~~LOC~~ SOLN
80.0000 mg | SUBCUTANEOUS | Status: DC
  Administered 2018-11-26 – 2018-11-28 (×3): 80 mg via SUBCUTANEOUS
  Filled 2018-11-25 (×3): qty 0.8

## 2018-11-25 MED ORDER — METHYLERGONOVINE MALEATE 0.2 MG/ML IJ SOLN: 0.2000 mg | INTRAMUSCULAR | Status: DC | PRN

## 2018-11-25 MED ORDER — METOCLOPRAMIDE HCL 5 MG/ML IJ SOLN
INTRAMUSCULAR | Status: AC
  Filled 2018-11-25: qty 2

## 2018-11-25 MED ORDER — DEXAMETHASONE SODIUM PHOSPHATE 10 MG/ML IJ SOLN
INTRAMUSCULAR | Status: DC | PRN
  Administered 2018-11-25: 10 mg via INTRAVENOUS

## 2018-11-25 MED ORDER — NALOXONE HCL 0.4 MG/ML IJ SOLN: 0.4000 mg | INTRAMUSCULAR | Status: DC | PRN

## 2018-11-25 SURGICAL SUPPLY — 44 items
BENZOIN TINCTURE PRP APPL 2/3 (GAUZE/BANDAGES/DRESSINGS) ×3 IMPLANT
CHLORAPREP W/TINT 26ML (MISCELLANEOUS) ×3 IMPLANT
CLAMP CORD UMBIL (MISCELLANEOUS) IMPLANT
CLOSURE STERI STRIP 1/2 X4 (GAUZE/BANDAGES/DRESSINGS) ×3 IMPLANT
CLOTH BEACON ORANGE TIMEOUT ST (SAFETY) ×3 IMPLANT
DECANTER SPIKE VIAL GLASS SM (MISCELLANEOUS) ×3 IMPLANT
DRAIN JACKSON PRT FLT 10 (DRAIN) ×3 IMPLANT
DRSG OPSITE POSTOP 4X10 (GAUZE/BANDAGES/DRESSINGS) ×6 IMPLANT
ELECT REM PT RETURN 9FT ADLT (ELECTROSURGICAL) ×3
ELECTRODE REM PT RTRN 9FT ADLT (ELECTROSURGICAL) ×1 IMPLANT
EVACUATOR SILICONE 100CC (DRAIN) ×3 IMPLANT
EXTRACTOR VACUUM M CUP 4 TUBE (SUCTIONS) IMPLANT
EXTRACTOR VACUUM M CUP 4' TUBE (SUCTIONS)
GLOVE BIO SURGEON STRL SZ7.5 (GLOVE) ×3 IMPLANT
GLOVE BIOGEL PI IND STRL 7.0 (GLOVE) ×1 IMPLANT
GLOVE BIOGEL PI IND STRL 7.5 (GLOVE) ×1 IMPLANT
GLOVE BIOGEL PI INDICATOR 7.0 (GLOVE) ×2
GLOVE BIOGEL PI INDICATOR 7.5 (GLOVE) ×2
GOWN STRL REUS W/TWL LRG LVL3 (GOWN DISPOSABLE) ×6 IMPLANT
HOVERMATT SINGLE USE (MISCELLANEOUS) ×3 IMPLANT
KIT ABG SYR 3ML LUER SLIP (SYRINGE) IMPLANT
NEEDLE HYPO 25X5/8 SAFETYGLIDE (NEEDLE) IMPLANT
NS IRRIG 1000ML POUR BTL (IV SOLUTION) ×3 IMPLANT
PACK C SECTION WH (CUSTOM PROCEDURE TRAY) ×3 IMPLANT
PAD ABD 7.5X8 STRL (GAUZE/BANDAGES/DRESSINGS) ×6 IMPLANT
PAD ABD 8X10 STRL (GAUZE/BANDAGES/DRESSINGS) ×6 IMPLANT
PAD OB MATERNITY 4.3X12.25 (PERSONAL CARE ITEMS) ×3 IMPLANT
PENCIL SMOKE EVAC W/HOLSTER (ELECTROSURGICAL) ×3 IMPLANT
RETRACTOR TRAXI PANNICULUS (MISCELLANEOUS) ×1 IMPLANT
RTRCTR C-SECT PINK 25CM LRG (MISCELLANEOUS) ×3 IMPLANT
STRIP CLOSURE SKIN 1/2X4 (GAUZE/BANDAGES/DRESSINGS) ×2 IMPLANT
SUT CHROMIC 2 0 CT 1 (SUTURE) ×3 IMPLANT
SUT MNCRL AB 3-0 PS2 27 (SUTURE) ×3 IMPLANT
SUT PLAIN 2 0 XLH (SUTURE) ×9 IMPLANT
SUT SILK 0 SH 30 (SUTURE) ×3 IMPLANT
SUT VIC AB 0 CT1 36 (SUTURE) ×3 IMPLANT
SUT VIC AB 0 CTX 36 (SUTURE) ×6
SUT VIC AB 0 CTX36XBRD ANBCTRL (SUTURE) ×3 IMPLANT
SUT VIC AB 2-0 SH 27 (SUTURE) ×4
SUT VIC AB 2-0 SH 27XBRD (SUTURE) ×2 IMPLANT
TOWEL OR 17X24 6PK STRL BLUE (TOWEL DISPOSABLE) ×3 IMPLANT
TRAXI PANNICULUS RETRACTOR (MISCELLANEOUS) ×2
TRAY FOLEY W/BAG SLVR 14FR LF (SET/KITS/TRAYS/PACK) ×3 IMPLANT
WATER STERILE IRR 1000ML POUR (IV SOLUTION) ×3 IMPLANT

## 2018-11-25 NOTE — Op Note (Signed)
Preoperative diagnosis: Intrauterine pregnancy at 38 weeks and 2 days, previous cesarean section, gestational hypertension, polyhydramnios, morbid obesity with BMI 52   Post operative diagnosis: Same with peri-operative placental abruption  Anesthesia: Spinal  Anesthesiologist: Dr. Sabra Heck  Procedure: Repeat low transverse cesarean section  Surgeon: Dr. Katharine Look Kaydin Karbowski  Assistant: Salomon Mast RNFA  Quantitated blood loss: 1556 cc   Procedure:  After being informed of the planned procedure and possible complications including bleeding, infection, injury to other organs, informed consent is obtained. The patient is taken to the OR and given spinal anesthesia without complication. She is placed in the dorsal decubitus position with the pelvis tilted to the left. She is then prepped and draped in a sterile fashion. A Foley catheter is inserted in her bladder.  After assessing adequate level of anesthesia, we infiltrate the suprapubic area with 20 cc of Marcaine 0.25 and perform a Pfannenstiel incision which is brought down sharply to the fascia. The fascia is entered in a low transverse fashion. Linea alba is dissected. Peritoneum is entered in a midline fashion. An Alexis retractor is easily positioned.   The myometrium is then entered in a low transverse fashion, 2 cm above the vesico-uterine junction ; first with knife and then extended bluntly. Amniotic fluid is very abundant:5000 cc, initially clear then wine colored due to ongoing placental abruption. We assist the birth of a female  infant in vertex presentation. 1 nuchal cord is reduced. Mouth and nose are suctioned. The baby is delivered. The cord is clamped and sectioned. The baby is given to the neonatologist present in the room.  The placenta  Delivered  Spontaneously with the baby. It is complete and the cord has 3 vessels. Uterine revision is negative.  We proceed with closure of the myometrium in 2 layers: First with a running locked  suture of 0 Vicryl, then with a Lembert suture of 0 Vicryl imbricating the first one. Hemostasis is completed with cauterization on peritoneal edges.  Both paracolic gutters are cleaned. Right tube and ovary are normal.Left tube is absent from a previous ruptured ectopic pregnancy and left ovary is only a thin remnant The pelvis is profusely irrigated with warm saline to confirm a satisfactory hemostasis.  Retractors and sponges are removed. Under fascia hemostasis is completed with cauterization. The fascia is then closed with 2 running sutures of 0 Vicryl meeting midline. The wound is irrigated with warm saline and hemostasis is completed with cauterization. A #11 JP drain is placed above the fascia and held in place with a 0 Silk suture.The subcuticular layer is closed with interrupted suture of 2-0 Plain.The skin is closed with a subcuticular suture of 3-0 Monocryl and Steri-Strips.  Instrument and sponge count is complete x2. Quantitated blood loss is 1556 cc likely overestimated due to the excessive amount of amniotic fluid. Estimated blood loss is 900 cc.  The procedure is well tolerated by the patient who is taken to recovery room in a well and stable condition.  female baby named Evonnie Pat was born at 10:59 and received an Apgar of 8  at 1 minute and 9 at 5 minutes.    Specimen: Placenta sent to L & D   Dede Query Coraima Tibbs MD 8/25/202012:30 PM

## 2018-11-25 NOTE — Transfer of Care (Signed)
Immediate Anesthesia Transfer of Care Note  Patient: Joan Garcia  Procedure(s) Performed: REPEAT CESAREAN SECTION (N/A )  Patient Location: PACU  Anesthesia Type:Spinal  Level of Consciousness: awake  Airway & Oxygen Therapy: Patient Spontanous Breathing  Post-op Assessment: Report given to RN  Post vital signs: Reviewed and stable  Last Vitals:  Vitals Value Taken Time  BP 101/47 11/25/18 1154  Temp    Pulse 95 11/25/18 1156  Resp 24 11/25/18 1156  SpO2 97 % 11/25/18 1156  Vitals shown include unvalidated device data.  Last Pain:  Vitals:   11/25/18 1015  TempSrc: Oral  PainSc:          Complications: No apparent anesthesia complications

## 2018-11-25 NOTE — OR Nursing (Signed)
Called to give report to [place] mbu rn . Unable to give report nurse on another call.

## 2018-11-25 NOTE — Anesthesia Preprocedure Evaluation (Signed)
Anesthesia Evaluation  Patient identified by MRN, date of birth, ID band Patient awake    Reviewed: Allergy & Precautions, NPO status , Patient's Chart, lab work & pertinent test results  Airway Mallampati: II  TM Distance: >3 FB Neck ROM: Full    Dental no notable dental hx.    Pulmonary neg pulmonary ROS, former smoker,    Pulmonary exam normal breath sounds clear to auscultation       Cardiovascular hypertension, Pt. on medications negative cardio ROS Normal cardiovascular exam Rhythm:Regular Rate:Normal     Neuro/Psych Depression negative neurological ROS  negative psych ROS   GI/Hepatic negative GI ROS, Neg liver ROS,   Endo/Other  Morbid obesity  Renal/GU negative Renal ROS  negative genitourinary   Musculoskeletal negative musculoskeletal ROS (+)   Abdominal (+) + obese,   Peds negative pediatric ROS (+)  Hematology negative hematology ROS (+)   Anesthesia Other Findings   Reproductive/Obstetrics (+) Pregnancy                             Anesthesia Physical Anesthesia Plan  ASA: III  Anesthesia Plan: Spinal   Post-op Pain Management:    Induction:   PONV Risk Score and Plan: 2 and Treatment may vary due to age or medical condition  Airway Management Planned: Natural Airway  Additional Equipment:   Intra-op Plan:   Post-operative Plan:   Informed Consent: I have reviewed the patients History and Physical, chart, labs and discussed the procedure including the risks, benefits and alternatives for the proposed anesthesia with the patient or authorized representative who has indicated his/her understanding and acceptance.     Dental advisory given  Plan Discussed with: CRNA  Anesthesia Plan Comments:         Anesthesia Quick Evaluation

## 2018-11-25 NOTE — Anesthesia Postprocedure Evaluation (Signed)
Anesthesia Post Note  Patient: Joan Garcia  Procedure(s) Performed: REPEAT CESAREAN SECTION (N/A )     Patient location during evaluation: PACU Anesthesia Type: Spinal Level of consciousness: oriented and awake and alert Pain management: pain level controlled Vital Signs Assessment: post-procedure vital signs reviewed and stable Respiratory status: spontaneous breathing and respiratory function stable Cardiovascular status: blood pressure returned to baseline and stable Postop Assessment: no headache, no backache and no apparent nausea or vomiting Anesthetic complications: no    Last Vitals:  Vitals:   11/25/18 1300 11/25/18 1310  BP: 116/64 (!) 128/41  Pulse: 94 88  Resp: 18 18  Temp: 36.5 C 36.5 C  SpO2: 99%     Last Pain:  Vitals:   11/25/18 1300  TempSrc: Oral  PainSc: 0-No pain   Pain Goal:    LLE Motor Response: Purposeful movement (11/25/18 1300)   RLE Motor Response: Purposeful movement (11/25/18 1300)       Epidural/Spinal Function Cutaneous sensation: Able to Discern Pressure (11/25/18 1300), Patient able to flex knees: Yes (11/25/18 1300), Patient able to lift hips off bed: Yes (11/25/18 1300), Back pain beyond tenderness at insertion site: No (11/25/18 1300), Progressively worsening motor and/or sensory loss: No (11/25/18 1300), Bowel and/or bladder incontinence post epidural: No (11/25/18 1300)  Lynda Rainwater

## 2018-11-25 NOTE — Interval H&P Note (Signed)
History and Physical Interval Note:  11/25/2018 10:03 AM  Joan Garcia  has presented today for surgery, with the diagnosis of Prior Cesarean Section.  The various methods of treatment have been discussed with the patient and family. After consideration of risks, benefits and other options for treatment, the patient has consented to  Procedure(s) with comments: REPEAT CESAREAN SECTION (N/A) - [place] as a surgical intervention.  The patient's history has been reviewed, patient examined, no change in status, stable for surgery.  I have reviewed the patient's chart and labs.  Questions were answered to the patient's satisfaction.     Katharine Look A Sabatino Williard

## 2018-11-26 LAB — CBC
HCT: 24.6 % — ABNORMAL LOW (ref 36.0–46.0)
Hemoglobin: 7.6 g/dL — ABNORMAL LOW (ref 12.0–15.0)
MCH: 27.6 pg (ref 26.0–34.0)
MCHC: 30.9 g/dL (ref 30.0–36.0)
MCV: 89.5 fL (ref 80.0–100.0)
Platelets: 156 10*3/uL (ref 150–400)
RBC: 2.75 MIL/uL — ABNORMAL LOW (ref 3.87–5.11)
RDW: 14.7 % (ref 11.5–15.5)
WBC: 14.3 10*3/uL — ABNORMAL HIGH (ref 4.0–10.5)
nRBC: 0 % (ref 0.0–0.2)

## 2018-11-26 LAB — BIRTH TISSUE RECOVERY COLLECTION (PLACENTA DONATION)

## 2018-11-26 NOTE — Progress Notes (Signed)
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  CSW met with Joan Garcia to offer support and complete assessment.    Joan Garcia resting in bed doing skin-to-skin with infant with FOB present at bedside, when CSW entered the room. CSW introduced self and received verbal permission from Joan Garcia to complete assessment with FOB present. Joan Garcia and FOB both very pleasant and engaged throughout assessment. Joan Garcia attentive and appropriate with infant during visit. CSW inquired about Joan Garcia's PPD history and Joan Garcia acknowledged experiencing PPD with her 2-year-old about two weeks in. Per Joan Garcia, she was having trouble with breast feeding, difficulty sleeping, and was experiencing guilt following the loss of a pregnancy. Joan Garcia reported symptoms lasted for about 8 months but noticed some depression around her daughter's 1st birthday but attributed it to life stressors at the time. Joan Garcia shared she was prescribed Lexapro at the time but stopped taking it as soon as she was feeling better. Joan Garcia reported she also attended a counseling session during pregnancy and has that as an option once discharged. Joan Garcia stated she has also talked to her doctor about having a prescription for Lexapro to discharge with for if she feels she needs it. Joan Garcia explained she is active with a psychiatrist who prescribes her medications for her ADHD and that she has an appointment scheduled for October 9th. Joan Garcia appeared to be insightful and self aware of her current mental health. CSW provided education regarding the baby blues period vs. perinatal mood disorders, discussed treatment and gave resources for mental health follow up if concerns arise.  CSW recommends self-evaluation during the postpartum time period using the New Mom Checklist from Postpartum Progress and encouraged Joan Garcia to contact a medical professional if symptoms are noted at any time. Joan Garcia did not appear to be displaying any acute mental health symptoms and denied any SI or HI. Joan Garcia reported primary supports as FOB and her mother.  Joan Garcia shared her mother would be helping out a lot after discharge.    Joan Garcia confirmed having all essential items for infant once discharged and reported infant would be sleeping in a basinet once home. CSW provided review of Sudden Infant Death Syndrome (SIDS) precautions and safe sleeping habits.    CSW identifies no further need for intervention and no barriers to discharge at this time.  Brooks Stotz, LCSWA  [facility] [phone]  

## 2018-11-26 NOTE — Progress Notes (Signed)
Subjective: Postpartum Day 1: Cesarean Delivery repeat with poly Patient reports tolerating PO and no problems voiding.  Ambulating, denies dizziness  Objective: Vital signs in last 24 hours: Temp:  [97.7 F (36.5 C)-98.4 F (36.9 C)] 97.8 F (36.6 C) (08/26 0622) Pulse Rate:  [63-103] 63 (08/26 0622) Resp:  [14-22] 19 (08/26 0622) BP: (97-128)/(37-71) 100/63 (08/26 0622) SpO2:  [96 %-100 %] 99 % (08/26 0622)  Physical Exam:  General: alert, cooperative and no distress Lochia: appropriate Uterine Fundus: firm Incision: no significant drainage, drain small mount noted DVT Evaluation: No evidence of DVT seen on physical exam.  Recent Labs    11/24/18 1533  HGB 10.5*  HCT 32.8*    Assessment/Plan: Status post Cesarean section. Doing well postoperatively.  Continue current care.  Pleas Koch Natalyah Cummiskey 11/26/2018, 6:58 AM

## 2018-11-26 NOTE — Lactation Note (Signed)
This note was copied from a baby's chart. Lactation Consultation Note:  Lactation Brochure given to mother and informed of available [place] services.  Mother reports that she has a 28 yr old at home that she breastfeed for a few weeks.  Mother reports that she plans to breastfeed infant for at least 67 weeks. She reports that she normally takes Aderall. She reports that she may start on this medication again in about 8 weeks. She reports that she plans to discuss another plan with her MD . Mother reports that she is able to hand express colostrum.  She reports that  Infant is feeding well. She denies having any nipple tenderness of discomfort.   Advised mother to continue to do frequent STS and cue base feed infant.  Mother receptive to all teaching.   Patient Name: Joan Garcia [id number] Date: 11/26/2018 Reason for consult: Initial assessment   Maternal Data Has patient been taught Hand Expression?: Yes Does the patient have breastfeeding experience prior to this delivery?: Yes  Feeding Feeding Type: Breast Fed  LATCH Score                   Interventions Interventions: Breast feeding basics reviewed  Lactation Tools Discussed/Used     Consult Status Consult Status: Follow-up Date: 11/26/18 Follow-up type: In-patient    Jess Barters [facility] 11/26/2018, 3:56 PM

## 2018-11-26 NOTE — Lactation Note (Signed)
This note was copied from a baby's chart. Lactation Consultation Note  Patient Name: Joan Garcia [id number] Date: 11/26/2018  P2, 80 hour female infant LC entered room mom and infant asleep.    Maternal Data    Feeding Feeding Type: Breast Milk  LATCH Score                   Interventions    Lactation Tools Discussed/Used     Consult Status      Vicente Serene 11/26/2018, 12:14 AM

## 2018-11-27 DIAGNOSIS — O9081 Anemia of the puerperium: Secondary | ICD-10-CM | POA: Diagnosis not present

## 2018-11-27 MED ORDER — FERROUS SULFATE 325 (65 FE) MG PO TABS: 325.0000 mg | ORAL_TABLET | Freq: Two times a day (BID) | ORAL | 3 refills | Status: AC

## 2018-11-27 MED ORDER — OXYCODONE HCL 5 MG PO TABS: 5.0000 mg | ORAL_TABLET | ORAL | 0 refills | Status: AC | PRN

## 2018-11-27 MED ORDER — IBUPROFEN 600 MG PO TABS: 600.0000 mg | ORAL_TABLET | Freq: Four times a day (QID) | ORAL | 0 refills | Status: AC | PRN

## 2018-11-27 NOTE — Discharge Summary (Signed)
Repeat CS OB Discharge Summary     Patient Name: Joan KoyanagiHarleigh D Garcia DOB: [birth year] MRN: [id number]  Date of admission: 11/24/2018 Delivering MD: Silverio LayIVARD, SANDRA   Date of discharge: 11/28/2018  Admitting diagnosis: 38WKS,MONITORING Intrauterine pregnancy: 2677w2d     Secondary diagnosis:  Principal Problem:   Gestational hypertension Active Problems:   BMI 50.0-59.9, adult (HCC)   Encounter for maternal care for low transverse scar from previous cesarean delivery   Postpartum anemia   Normal postpartum course  Additional problems: placenta abruption due to polyhydraminos, 5000mls amniotic fluid relieve then uterus rapidly abrupted, pt stable, QBL 1550mls, HGB dropped from  10.5-7.6, but pt asymptomatic.      Discharge diagnosis: Term Pregnancy Delivered, Gestational Hypertension and Anemia                                                                                                Post partum procedures:none  Augmentation: n/a  Complications: Placental Abruption and Hemorrhage>105300mL  Hospital course:  Sceduled C/S   28 y.o. yo [id number] at 8177w2d was admitted to the hospital 11/24/2018 for scheduled repeat  cesarean section with the following indication:Elective Repeat and gestational hypertension Membrane Rupture Time/Date: 10:57 AM ,11/25/2018   Patient delivered a Viable infant.11/25/2018  Details of operation can be found in separate operative note.  Pateint had an uncomplicated postpartum course.  She is ambulating, tolerating a regular diet, passing flatus, and urinating well. Pt JP drain removed on 8/27 and steri strip applied. Pt was to be discharge yesterday, but NB had to stay in hospital, pt to be discharged today, meds already sent to pharmacy yesterday. Pt denies HA, vision changes nor RUQ pain. BP has been normotensive during PP stay.  Patient is discharged home in stable condition on  11/28/18         Physical exam  Vitals:   11/26/18 1408 11/26/18 2208 11/27/18 0602  11/27/18 2116  BP: 101/65 (!) 100/59 103/69 110/64  Pulse: 69 72 76 71  Resp: 18  18   Temp: 98 F (36.7 C) 97.9 F (36.6 C) 97.8 F (36.6 C) 97.6 F (36.4 C)  TempSrc: Oral Axillary Oral Oral  SpO2: 99% 99% 99% 100%  Weight:      Height:       General: alert, cooperative and no distress Lochia: appropriate Uterine Fundus: firm Incision: Dressing is clean, dry, and intact DVT Evaluation: No evidence of DVT seen on physical exam. Labs: Lab Results  Component Value Date   WBC 14.3 (H) 11/26/2018   HGB 7.6 (L) 11/26/2018   HCT 24.6 (L) 11/26/2018   MCV 89.5 11/26/2018   PLT 156 11/26/2018   CMP Latest Ref Rng & Units 11/24/2018  Glucose 70 - 99 mg/dL 147(W107(H)  BUN 6 - 20 mg/dL 5(L)  Creatinine 2.950.44 - 1.00 mg/dL 6.210.59  Sodium 308135 - 657145 mmol/L 138  Potassium 3.5 - 5.1 mmol/L 3.6  Chloride 98 - 111 mmol/L 109  CO2 22 - 32 mmol/L 19(L)  Calcium 8.9 - 10.3 mg/dL 8.9  Total Protein 6.5 - 8.1 g/dL 5.9(L)  Total Bilirubin 0.3 -  1.2 mg/dL 0.5  Alkaline Phos 38 - 126 U/L 91  AST 15 - 41 U/L 21  ALT 0 - 44 U/L 17    Discharge instruction: per After Visit Summary and "Baby and Me Booklet".  After visit meds:  Allergies as of 11/28/2018   No Known Allergies     Medication List    TAKE these medications   ferrous sulfate 325 (65 FE) MG tablet Take 1 tablet (325 mg total) by mouth 2 (two) times daily with a meal.   ibuprofen 600 MG tablet Commonly known as: ADVIL Take 1 tablet (600 mg total) by mouth every 6 (six) hours as needed for moderate pain.   oxyCODONE 5 MG immediate release tablet Commonly known as: Oxy IR/ROXICODONE Take 1-2 tablets (5-10 mg total) by mouth every 4 (four) hours as needed for moderate pain.            Discharge Care Instructions  (From admission, onward)         Start     Ordered   11/28/18 0000  Discharge wound care:    Comments: Take dressing off on day 5-7 postpartum.  Report increased drainage, redness or warmth. Clean with water,  let soap trickle down body. Can leave steri strips on until they fall off or take them off gently at day 10. Keep open to air, clean and dry.   11/28/18 0131          Diet: routine diet  Activity: Advance as tolerated. Pelvic rest for 6 weeks.  Aemia: Iron BC: PP IUD at 6 weeks PPV Outpatient follow up:6 weeks Follow up Appt: [facility] in one week for BP check, then 6 weeks PPV.   Postpartum contraception: Undecided  Newborn Data: Live born female  Breast and bottle feeding now No circ desired Birth Weight: 7 lb 5.8 oz (3340 g) APGAR: 8, 9  Newborn Delivery   Birth date/time: 11/25/2018 10:59:00 Delivery type: C-Section, Low Transverse Trial of labor: No C-section categorization: Repeat      Baby Feeding: Breast Disposition:Pt discharge but may room in depending on Newborn status due to loss of weight after peds assesses.    11/28/2018 Noralyn Pick, FNP, CNM 1:31 AM

## 2018-11-27 NOTE — Lactation Note (Signed)
This note was copied from a baby's chart. Lactation Consultation Note Baby 58 hrs old. FOB holding sleeping baby. Baby has 9% wt. Loss at 49 hrs old. Out put 6 voids and 4 stools. Mom states baby has been cluster feeding. Mom supplemented at 0200 w/formula. Mom states she had Hx: of low milk supply w/her 1st child. Baby BF for 30 min. Rest 30 min. Then wants to feed 30 min. Mom states her nipples are getting sore from cluster feeding. Mom has everted nipples. Hand expression demonstrated. Mom stated she is unable to get anything when she hand expresses. Mom excited to see colostrum. Gave mom supplementing sheet how much according to hours of age to give. Mom states understanding. Mom has Similac 20 cal. Encouraged mom to rest. States she has been up a long time, very tired. Called RN to set up DEBP when mom wakes from nap. RN in rm. Will call Lead when available. Encouraged to call for latch and feeding assessment.  Patient Name: Joan Garcia [id number] Date: 11/27/2018 Reason for consult: Infant weight loss;Early term 37-38.6wks   Maternal Data Has patient been taught Hand Expression?: Yes Does the patient have breastfeeding experience prior to this delivery?: Yes  Feeding Feeding Type: Bottle Fed - Formula  LATCH Score       Type of Nipple: Everted at rest and after stimulation  Comfort (Breast/Nipple): Filling, red/small blisters or bruises, mild/mod discomfort(getting sore from cluster feeding)        Interventions Interventions: Breast feeding basics reviewed;Hand express;Breast compression  Lactation Tools Discussed/Used     Consult Status Consult Status: Follow-up Date: 11/27/18 Follow-up type: In-patient    [name] 11/27/2018, 4:39 AM

## 2018-11-27 NOTE — Discharge Summary (Addendum)
OB Discharge Summary     Patient Name: Joan Garcia DOB: 01/18/1991 MRN: [id number]  Date of admission: 11/24/2018 Delivering MD: Delsa Bern   Date of discharge: 11/27/2018  Admitting diagnosis: 38WKS,MONITORING Intrauterine pregnancy: [redacted]w[redacted]d     Secondary diagnosis:  Principal Problem:   Gestational hypertension Active Problems:   BMI 50.0-59.9, adult ([place])   Encounter for maternal care for low transverse scar from previous cesarean delivery  Additional problems: placenta abruption due to polyhydraminos     Discharge diagnosis: Term Pregnancy Delivered, Gestational Hypertension and Anemia                                                                                                Post partum procedures:none  Augmentation: n/a  Complications: Placental Abruption and Hemorrhage>1026mL  Hospital course:  Sceduled C/S   28 y.o. yo [id number] at [redacted]w[redacted]d was admitted to the hospital 11/24/2018 for scheduled cesarean section with the following indication:Elective Repeat and gestational hypertension Membrane Rupture Time/Date: 10:57 AM ,11/25/2018   Patient delivered a Viable infant.11/25/2018  Details of operation can be found in separate operative note.  Pateint had an uncomplicated postpartum course.  She is ambulating, tolerating a regular diet, passing flatus, and urinating well. Pt JP drain removed and steri strip applied.  Patient is discharged home in stable condition on  11/27/18         Physical exam  Vitals:   11/26/18 0916 11/26/18 1408 11/26/18 2208 11/27/18 0602  BP: 102/62 101/65 (!) 100/59 103/69  Pulse: 71 69 72 76  Resp: 18 18  18   Temp: 98 F (36.7 C) 98 F (36.7 C) 97.9 F (36.6 C) 97.8 F (36.6 C)  TempSrc: Axillary Oral Axillary Oral  SpO2: 98% 99% 99% 99%  Weight:      Height:       General: alert, cooperative and no distress Lochia: appropriate Uterine Fundus: firm Incision: Dressing is clean, dry, and intact DVT Evaluation: No evidence of  DVT seen on physical exam. Labs: Lab Results  Component Value Date   WBC 14.3 (H) 11/26/2018   HGB 7.6 (L) 11/26/2018   HCT 24.6 (L) 11/26/2018   MCV 89.5 11/26/2018   PLT 156 11/26/2018   CMP Latest Ref Rng & Units 11/24/2018  Glucose 70 - 99 mg/dL 107(H)  BUN 6 - 20 mg/dL 5(L)  Creatinine 0.44 - 1.00 mg/dL 0.59  Sodium 135 - 145 mmol/L 138  Potassium 3.5 - 5.1 mmol/L 3.6  Chloride 98 - 111 mmol/L 109  CO2 22 - 32 mmol/L 19(L)  Calcium 8.9 - 10.3 mg/dL 8.9  Total Protein 6.5 - 8.1 g/dL 5.9(L)  Total Bilirubin 0.3 - 1.2 mg/dL 0.5  Alkaline Phos 38 - 126 U/L 91  AST 15 - 41 U/L 21  ALT 0 - 44 U/L 17    Discharge instruction: per After Visit Summary and "Baby and Me Booklet".  After visit meds:  Allergies as of 11/27/2018   No Known Allergies     Medication List    TAKE these medications   ferrous sulfate 325 (65 FE) MG tablet Take  1 tablet (325 mg total) by mouth 2 (two) times daily with a meal.   ibuprofen 600 MG tablet Commonly known as: ADVIL Take 1 tablet (600 mg total) by mouth every 6 (six) hours as needed for moderate pain.   oxyCODONE 5 MG immediate release tablet Commonly known as: Oxy IR/ROXICODONE Take 1-2 tablets (5-10 mg total) by mouth every 4 (four) hours as needed for moderate pain.       Diet: routine diet  Activity: Advance as tolerated. Pelvic rest for 6 weeks.   Outpatient follow up:6 weeks Follow up Appt:No future appointments. Follow up Visit:No follow-ups on file.  Postpartum contraception: Undecided  Newborn Data: Live born female  Birth Weight: 7 lb 5.8 oz (3340 g) APGAR: 8, 9  Newborn Delivery   Birth date/time: 11/25/2018 10:59:00 Delivery type: C-Section, Low Transverse Trial of labor: No C-section categorization: Repeat      Baby Feeding: Breast Disposition:home with mother   11/27/2018 Kenney HousemanNancy Jean Prothero, CNM

## 2018-11-27 NOTE — Lactation Note (Signed)
This note was copied from a baby's chart. Lactation Consultation Note  Patient Name: Joan Garcia [id number] Date: 11/27/2018   Attempted lactation visit at 1355, but lights are out & family appearing to be sleeping.   Of note, Mom's EBL with C/S was 1556 mL.  Matthias Hughs [facility] 11/27/2018, 1:55 PM

## 2018-11-27 NOTE — Progress Notes (Signed)
Honeycomb dressing changed and JP drain removed by CNM. Timoteo Ace, RN

## 2018-11-28 ENCOUNTER — Inpatient Hospital Stay (HOSPITAL_COMMUNITY)
Admission: RE | Admit: 2018-11-28 | Discharge: 2018-11-28 | Disposition: A | Discharge status: Home / Self Care | Payer: BLUE CROSS/BLUE SHIELD | Source: Ambulatory Visit

## 2018-11-28 HISTORY — DX: Essential (primary) hypertension: I10

## 2018-11-28 HISTORY — DX: Gestational (pregnancy-induced) hypertension without significant proteinuria, unspecified trimester: O13.9

## 2018-11-28 HISTORY — DX: Anemia, unspecified: D64.9

## 2018-11-28 NOTE — Lactation Note (Signed)
This note was copied from a baby's chart. Lactation Consultation Note  Patient Name: Joan Garcia Date: 11/28/2018   Lactation consult attempted at 0755, but family sleeping. Infant noted to have gained 26g over 15 hrs (likely due to increased volume of formula supplementation).   Lactation to return.   Matthias Hughs [facility] 11/28/2018, 8:18 AM

## 2018-11-28 NOTE — Lactation Note (Signed)
This note was copied from a baby's chart. Lactation Consultation Note  Patient Name: Joan Garcia [id number] Date: 11/28/2018   Mom is offering the breast before every bottle feeding. Frequent swallows are noted (suck:swallow ratio of 1:1), but the feedings at the breast are short. I encouraged Mom to do breast compression to increase infant's length of time at breast.   When infant extends tongue, a slight divet is noted in the tip of his tongue, but Mom is comfortable with latch.   Mom has a Medela PIS at home. With her 1st child, her milk didn't come to volume until around the 8th day. With this infant, she feels that her breasts feel heavier and are larger. When I attempted hand expression, she sprayed a small amount. Mom says she is only getting 5 mL total with pumping. I encouraged Mom to use a hand-free bra so that she can do hands-on pumping. I also made sure Mom knew to turn up the suction (but where she was still comfortable) to promote drainage.   Mom knows to pump if infant doesn't have a full feeding at the breast and/or when infant gets formula. Mom has a hx of PPD with her 1st child. I explained to Mom that sleep should be a priority, which means that she may need to forego pumping at times so as to protect her mental health. Mom was in agreement & said she feels like she is "in a better head space" than with her 1st child.  Matthias Hughs [facility] 11/28/2018, 11:12 AM

## 2018-12-05 ENCOUNTER — Other Ambulatory Visit: Payer: Self-pay

## 2018-12-05 ENCOUNTER — Emergency Department (HOSPITAL_COMMUNITY)
Admission: EM | Admit: 2018-12-05 | Discharge: 2018-12-06 | Disposition: A | Discharge status: Home / Self Care | Payer: BC Managed Care – PPO | Attending: Emergency Medicine | Admitting: Emergency Medicine

## 2018-12-05 ENCOUNTER — Encounter (HOSPITAL_COMMUNITY): Payer: Self-pay | Admitting: Emergency Medicine

## 2018-12-05 DIAGNOSIS — Z5321 Procedure and treatment not carried out due to patient leaving prior to being seen by health care provider: Secondary | ICD-10-CM | POA: Diagnosis not present

## 2018-12-05 DIAGNOSIS — R109 Unspecified abdominal pain: Secondary | ICD-10-CM | POA: Diagnosis present

## 2018-12-05 LAB — COMPREHENSIVE METABOLIC PANEL
ALT: 37 U/L (ref 0–44)
AST: 24 U/L (ref 15–41)
Albumin: 3.3 g/dL — ABNORMAL LOW (ref 3.5–5.0)
Alkaline Phosphatase: 82 U/L (ref 38–126)
Anion gap: 10 (ref 5–15)
BUN: 11 mg/dL (ref 6–20)
CO2: 23 mmol/L (ref 22–32)
Calcium: 9.6 mg/dL (ref 8.9–10.3)
Chloride: 107 mmol/L (ref 98–111)
Creatinine, Ser: 0.75 mg/dL (ref 0.44–1.00)
GFR calc Af Amer: >60 mL/min (ref >60)
GFR calc non Af Amer: >60 mL/min (ref >60)
Glucose, Bld: 89 mg/dL (ref 70–99)
Potassium: 4.1 mmol/L (ref 3.5–5.1)
Sodium: 140 mmol/L (ref 135–145)
Total Bilirubin: 0.5 mg/dL (ref 0.3–1.2)
Total Protein: 6.8 g/dL (ref 6.5–8.1)

## 2018-12-05 LAB — CBC
HCT: 33.5 % — ABNORMAL LOW (ref 36.0–46.0)
Hemoglobin: 10.1 g/dL — ABNORMAL LOW (ref 12.0–15.0)
MCH: 27.5 pg (ref 26.0–34.0)
MCHC: 30.1 g/dL (ref 30.0–36.0)
MCV: 91.3 fL (ref 80.0–100.0)
Platelets: 294 10*3/uL (ref 150–400)
RBC: 3.67 MIL/uL — ABNORMAL LOW (ref 3.87–5.11)
RDW: 15.2 % (ref 11.5–15.5)
WBC: 8.9 10*3/uL (ref 4.0–10.5)
nRBC: 0 % (ref 0.0–0.2)

## 2018-12-05 LAB — URINALYSIS, ROUTINE W REFLEX MICROSCOPIC
Bilirubin Urine: NEGATIVE
Glucose, UA: NEGATIVE mg/dL
Ketones, ur: NEGATIVE mg/dL
Nitrite: NEGATIVE
Protein, ur: NEGATIVE mg/dL
Specific Gravity, Urine: 1.005 (ref 1.005–1.030)
pH: 6 (ref 5.0–8.0)

## 2018-12-05 LAB — LIPASE, BLOOD: Lipase: 31 U/L (ref 11–51)

## 2018-12-05 MED ORDER — SODIUM CHLORIDE 0.9% FLUSH: 3.0000 mL | Freq: Once | INTRAVENOUS | Status: DC

## 2018-12-05 NOTE — ED Triage Notes (Signed)
Patient reports generalized abdominal pain with nausea onset this afternoon , denies emesis or diarrhea , no fever or chills , C-section last 11/25/18. Denies vaginal bleeding /incision intact.

## 2018-12-06 NOTE — ED Notes (Signed)
Pt left with husband. States wait is too long.

## 2020-05-07 IMAGING — US US MFM FETAL BPP WO NON STRESS
1 series · 15 of 28 positions shown · non-contrast
Comparison: none

[Series 1: us mfm fetal bpp wo non stress · 45 acquisitions, 15 frames shown]
[im 1/45]
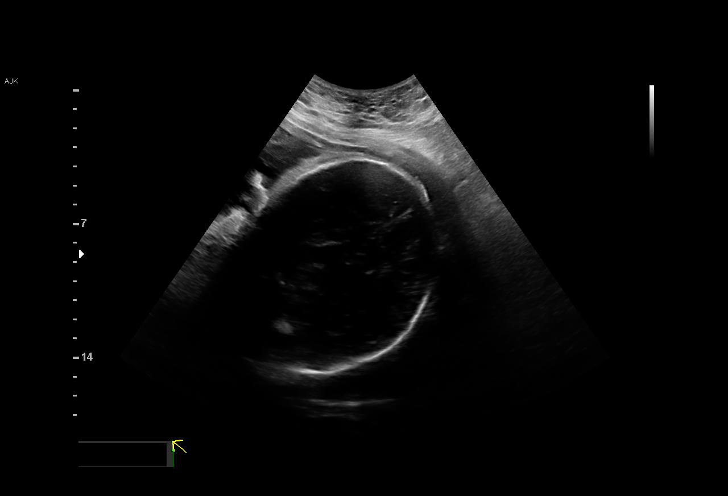
[im 4/45]
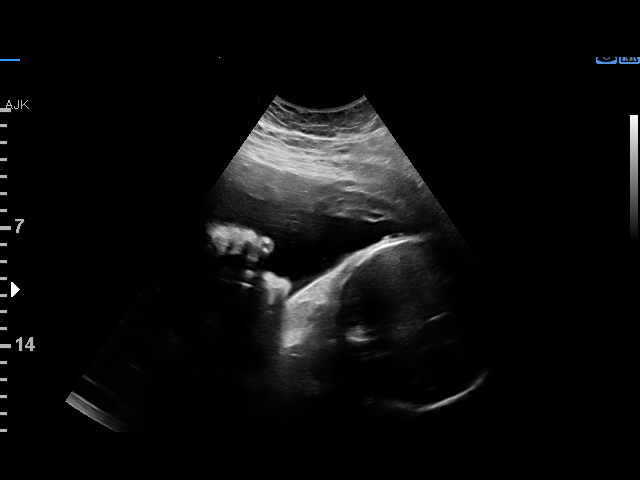
[im 7/45]
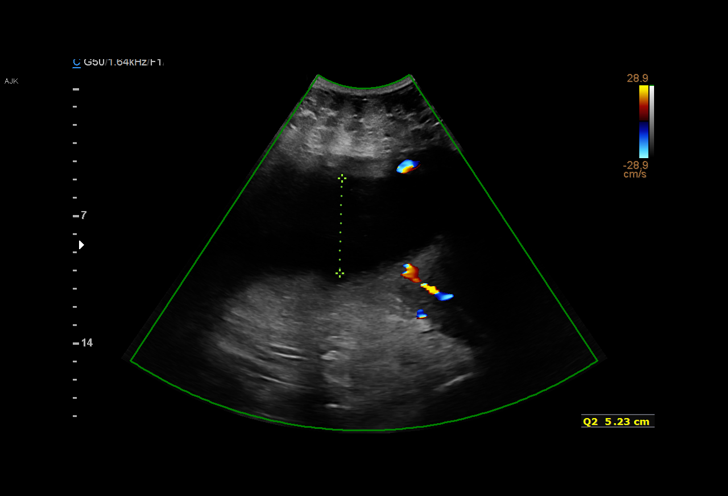
[im 10/45]
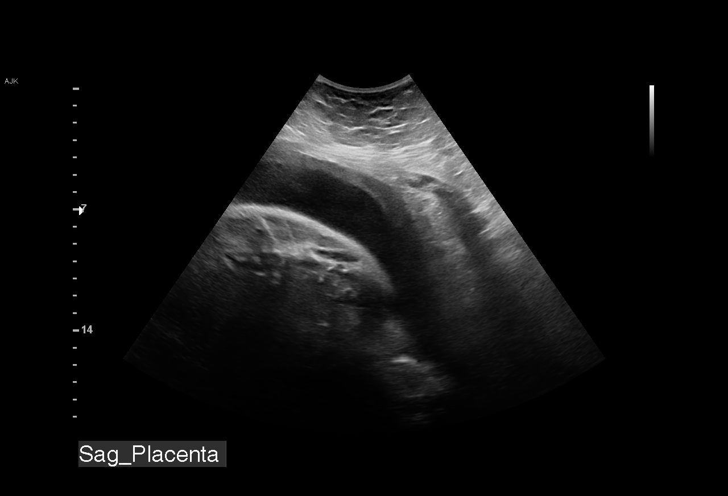
[im 14/45]
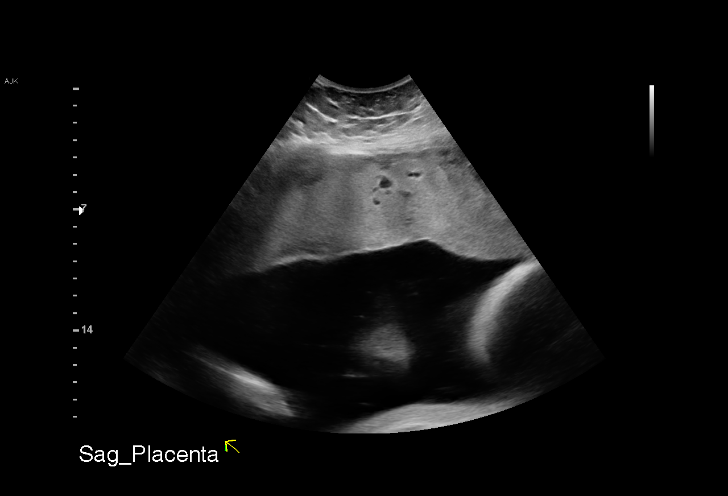
[im 17/45]
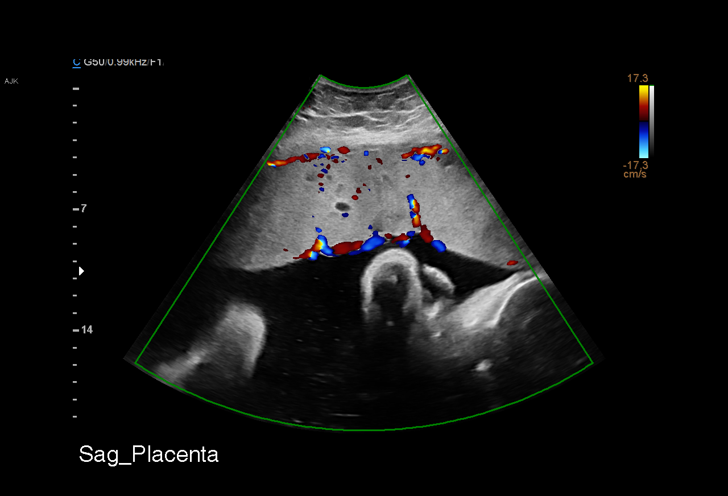
[im 20/45]
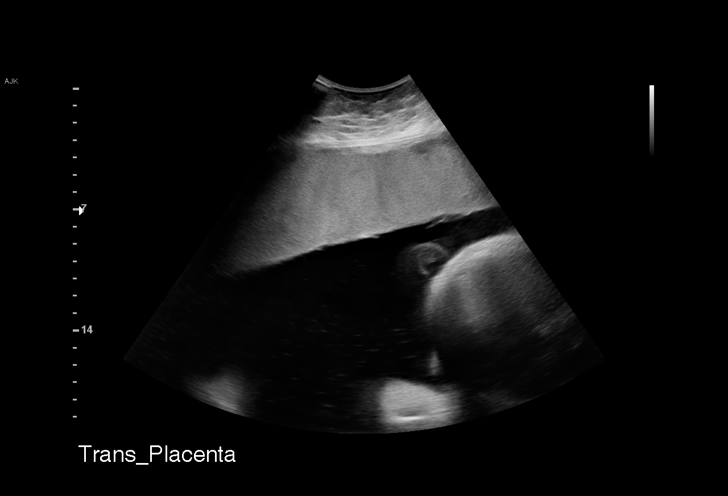
[im 23/45]
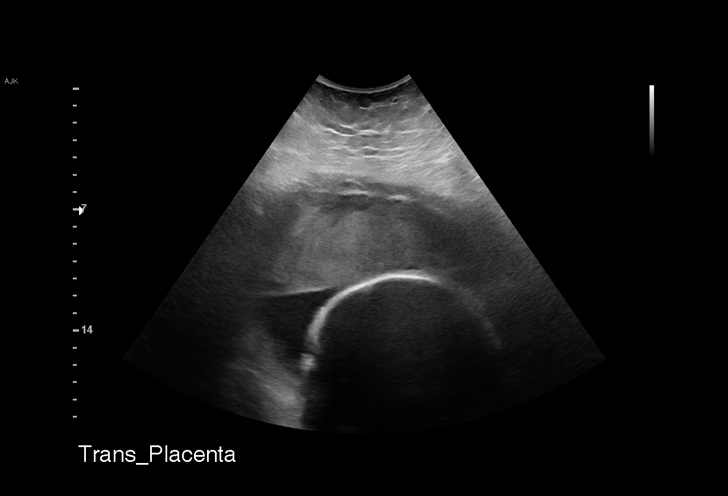
[im 25/45]
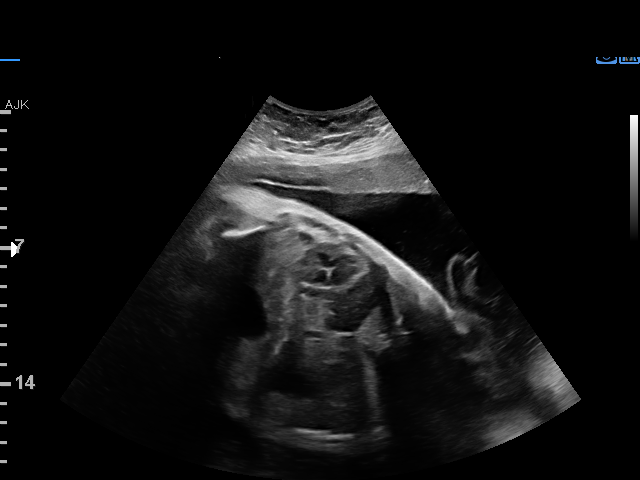
[im 28/45]
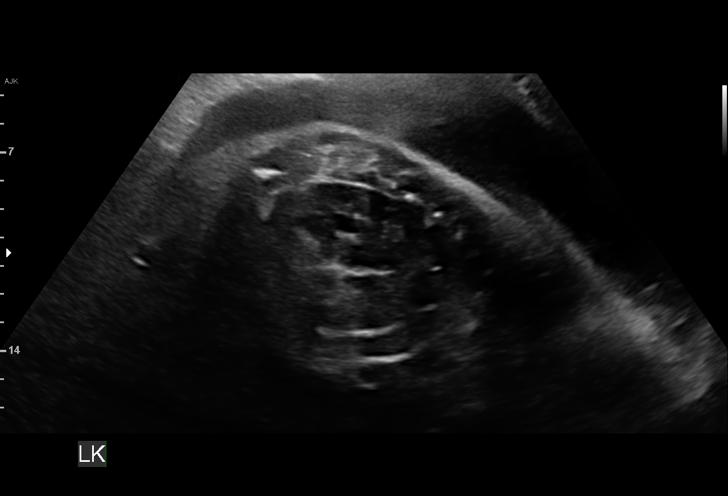
[im 31/45]
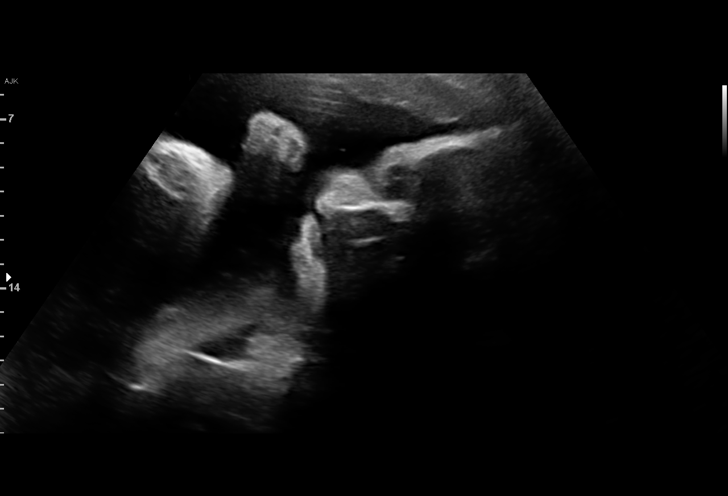
[im 35/45]
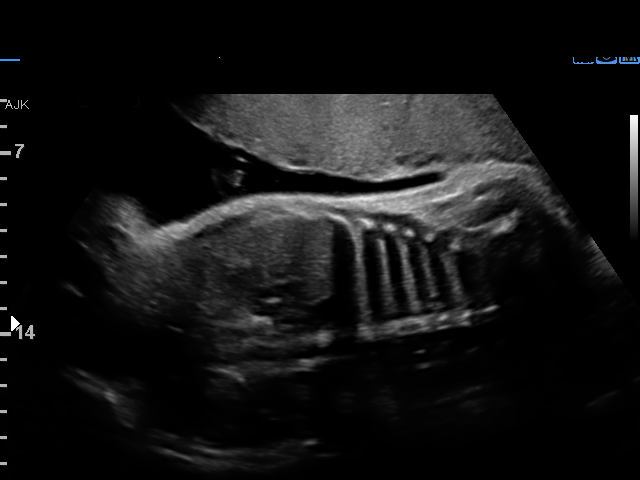
[im 38/45]
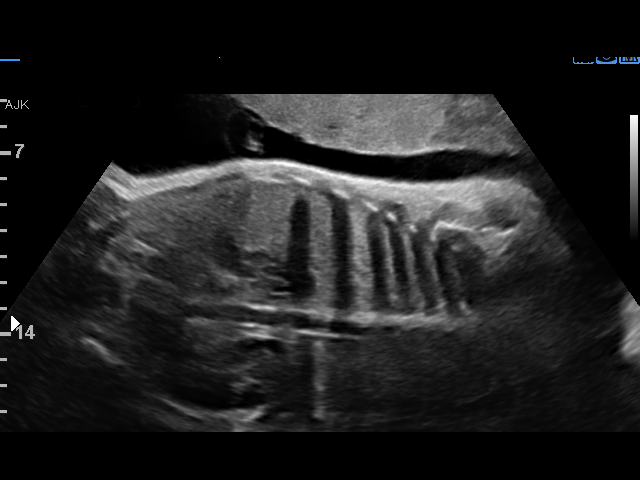
[im 41/45]
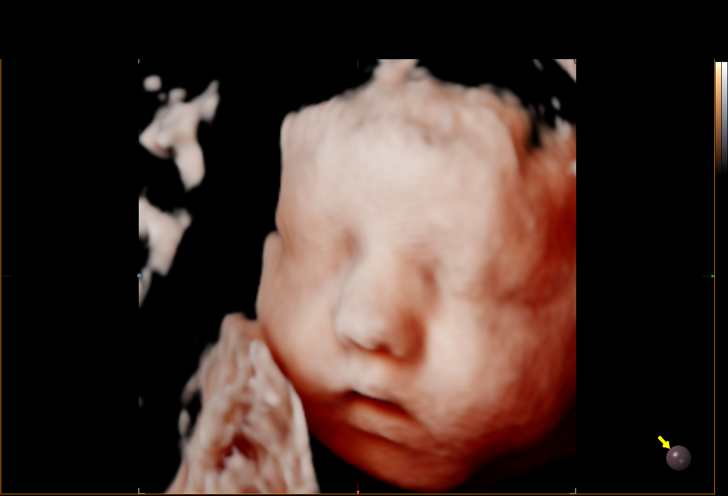
[im 45/45]
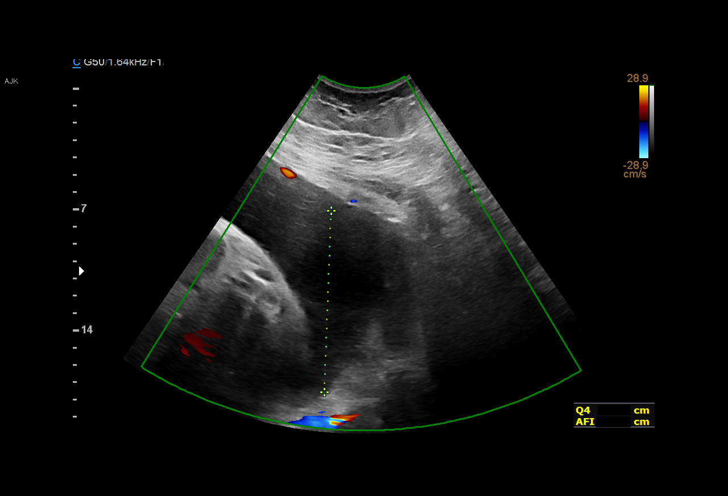

[15 of 28 positions shown; findings below may reference images not displayed]

----------------------------------------------------------------------

 ----------------------------------------------------------------------
Indications

  38 weeks gestation of pregnancy
  Non-reactive NST
  Maternal morbid obesity
  Abdominal pain in pregnancy
  Polyhydramnios, third trimester, antepartum
  condition or complication, unspecified fetus
 ----------------------------------------------------------------------
Vital Signs

 BMI:
Fetal Evaluation

 Num Of Fetuses:          1
 Fetal Heart Rate(bpm):   140
 Cardiac Activity:        Observed
 Presentation:            Cephalic
 Placenta:                Anterior
 P. Cord Insertion:       Visualized

 Amniotic Fluid
 AFI FV:      Polyhydramnios

 AFI Sum(cm)     %Tile       Largest Pocket(cm)
 39.54           > 97

 RUQ(cm)       RLQ(cm)       LUQ(cm)        LLQ(cm)

Biophysical Evaluation

 Amniotic F.V:   Polyhydramnios             F. Tone:         Observed
 F. Movement:    Observed                   Score:           [DATE]
 F. Breathing:   Observed
OB History

 Gravidity:    4         Term:   1         SAB:   1
 Ectopic:      1        Living:  1
Gestational Age

 Clinical EDD:  38w 1d                                        EDD:   12/07/18
 Best:          38w 1d     Det. By:  Clinical EDD             EDD:   12/07/18
Impression

 BPP [DATE] (-2 NRNST)
 Moderate-severe polyhydramnios
Recommendations

 Consider delivery at 39 weeks
 Continue Iczel [employer]s.
